# Patient Record
Sex: Female | Born: 1945 | Race: White | Hispanic: No | Smoking: Never smoker
Health system: Southern US, Community
[De-identification: ages and names within clinical notes are randomized; demographics above are authoritative.]

## PROBLEM LIST (undated history)

## (undated) DIAGNOSIS — E119 Type 2 diabetes mellitus without complications: Secondary | ICD-10-CM

## (undated) DIAGNOSIS — G20 Parkinson's disease: Secondary | ICD-10-CM

## (undated) DIAGNOSIS — I10 Essential (primary) hypertension: Secondary | ICD-10-CM

## (undated) DIAGNOSIS — G20A1 Parkinson's disease without dyskinesia, without mention of fluctuations: Secondary | ICD-10-CM

## (undated) DIAGNOSIS — E785 Hyperlipidemia, unspecified: Secondary | ICD-10-CM

## (undated) HISTORY — DX: Parkinson's disease: G20

## (undated) HISTORY — PX: BREAST CYST ASPIRATION: SHX578

## (undated) HISTORY — DX: Hyperlipidemia, unspecified: E78.5

## (undated) HISTORY — DX: Essential (primary) hypertension: I10

## (undated) HISTORY — DX: Parkinson's disease without dyskinesia, without mention of fluctuations: G20.A1

## (undated) HISTORY — DX: Type 2 diabetes mellitus without complications: E11.9

---

## 2004-07-01 ENCOUNTER — Ambulatory Visit: Payer: Self-pay | Admitting: Family Medicine

## 2009-09-18 ENCOUNTER — Ambulatory Visit: Payer: Self-pay | Admitting: Unknown Physician Specialty

## 2013-10-25 ENCOUNTER — Ambulatory Visit: Payer: Self-pay | Admitting: Family Medicine

## 2014-06-17 DIAGNOSIS — E782 Mixed hyperlipidemia: Secondary | ICD-10-CM | POA: Diagnosis present

## 2014-06-17 DIAGNOSIS — I10 Essential (primary) hypertension: Secondary | ICD-10-CM | POA: Diagnosis present

## 2014-10-21 ENCOUNTER — Ambulatory Visit
Admit: 2014-10-21 | Disposition: A | Discharge status: Home / Self Care | Payer: Self-pay | Attending: Unknown Physician Specialty | Admitting: Unknown Physician Specialty

## 2014-10-28 LAB — SURGICAL PATHOLOGY

## 2015-06-03 DIAGNOSIS — G20 Parkinson's disease: Secondary | ICD-10-CM | POA: Diagnosis present

## 2015-08-18 ENCOUNTER — Ambulatory Visit: Payer: Medicare Other | Admitting: Speech Pathology

## 2015-08-22 ENCOUNTER — Encounter: Payer: Self-pay | Admitting: Speech Pathology

## 2015-08-22 ENCOUNTER — Ambulatory Visit: Payer: Medicare Other | Attending: Neurology | Admitting: Speech Pathology

## 2015-08-22 DIAGNOSIS — R490 Dysphonia: Secondary | ICD-10-CM | POA: Insufficient documentation

## 2015-08-22 NOTE — Therapy (Signed)
[place] [facility] MAIN [facility] SERVICES [address] [place], [place], [address] Phone: [phone]   Fax:  [phone]  Speech Language Pathology Evaluation  Patient Details  Name: Lorraine Wyatt MRN: [id number] Date of Birth: 10-18-1945 Referring Provider: Paulita Fujita. Malvin Johns, MD  Encounter Date: 08/22/2015      End of Session - 08/22/15 1412    Visit Number 1   Number of Visits 17   Date for SLP Re-Evaluation 09/22/15   SLP Start Time 1100   SLP Stop Time  1150   SLP Time Calculation (min) 50 min   Activity Tolerance Patient tolerated treatment well      Past Medical History  Diagnosis Date   Diabetes mellitus without complication (HCC)    Hyperlipidemia    Hypertension    Parkinson disease (HCC)     History reviewed. No pertinent past surgical history.  There were no vitals filed for this visit.  Visit Diagnosis: Dysphonia      Subjective Assessment - 08/22/15 1410    Subjective The patient reports speech/voice changes due to Parkinson's; changes include hypophonia and hoarse vocal quality.  In addition, the patient reports that although she feels she speaks at an appropriate volume, she has received feedback from multiple sources over the past few months that she is quieter and less intelligible than she once was.  There are no reported swallowing problems.   Currently in Pain? No/denies            SLP Evaluation [facility] - 08/22/15 0001    SLP Visit Information   SLP Received On 08/22/15   Referring Provider Paulita Fujita. Malvin Johns, MD   Medical Diagnosis Parkinson's Disease   Prior Functional Status   Cognitive/Linguistic Baseline Within functional limits   Oral Motor/Sensory Function   Overall Oral Motor/Sensory Function Appears within functional limits for tasks assessed   Motor Speech   Overall Motor Speech Impaired   Respiration Within functional limits   Phonation Hoarse;Low vocal intensity   Resonance Within functional  limits   Articulation Within functional limitis   Intelligibility Intelligibility reduced   Word 75-100% accurate   Phrase 75-100% accurate   Sentence 75-100% accurate   Conversation 75-100% accurate   Motor Planning Witnin functional limits   Standardized Assessments   Standardized Assessments  Other Assessment  LSVT-Loud Evaluation       Maximum phonation time for sustained ah: 15 seconds Mean intensity during sustained ah: 75 dB  Mean intensity sustained during conversational speech: 63 dB Average fundamental frequency during sustained ah: 181 Hz (2.2 STD below mean for age and gender) Highest dynamic pitch when altering pitch from a low note to a high note: 457 Hz Highest pitch during conversational speech: 152 Hz Lowest dynamic pitch when altering from a high note to a low note: 138 Hz Lowest pitch during conversational speech: 121 Hz Visi-Pitch: Multi-Dimensional Voice Program (MDVP)  MDVP extracts objective quantitative values (Relative Average Perturbation, Shimmer, Voice Turbulence Index, and Noise to Harmonic Ratio) on sustained phonation, which are displayed graphically and numerically in comparison to a built-in normative database.  The patient exhibited values in the normal range for all dimensions. Average fundamental frequency was 2.2 STD below the average for age and gender. Stimulability: The patient was stimulable for achieving good quality, loud voice with moderate cues from clinician.                  SLP Education - 08/22/15 1411    Education provided  Yes   Education Details LSVT-Loud program and protocol    Person(s) Educated Patient   Methods Explanation;Demonstration   Comprehension Verbalized understanding;Returned demonstration            SLP Long Term Goals - 08/22/15 1421    SLP LONG TERM GOAL #1   Title The patient will complete Daily Tasks (Maximum duration "ah", High/Lows, and Functional Phrases) at average loudness of 80 dB  and with loud, good quality voice.    SLP LONG TERM GOAL #2   Title The patient will complete Hierarchal Speech Loudness reading drills (words/phrases, sentences, and paragraph) at average 75 dB and with loud, good quality voice.     SLP LONG TERM GOAL #3   Title The patient will complete homework daily.   SLP LONG TERM GOAL #4   Title The patient will participate in conversation, maintaining average loudness of 75 dB and loud, good quality voice.          Plan - 08/22/15 1413    Clinical Impression Statement Lorraine Wyatt is a pleasant, 70 year old female that presents today for an LSVT-Loud voice evaluation as a result of her recent diagnosis of Parkinson's disease. She is presenting with moderate dysphonia secondary to Parkinson's disease. The patient notes that she has received feedback from multiple sources over the past few months that she is less intelligible and quieter than she used to be. Although Ms. Mckendree does not perceive any changes in her overall loudness, she does feel that she experiences hoarseness and speaks with a slightly lower pitch. Ms. Rigdon was stimulable for achieving good quality, loud voice with moderate cues from the clinician today, and has a good prognosis for success in the LSVT-Loud program.    Speech Therapy Frequency 4x / week   Duration 4 weeks   Treatment/Interventions Other (comment)  LSVT-Loud   Potential to Achieve Goals Good   Potential Considerations Ability to learn/carryover information   SLP Home Exercise Plan LSVT-Loud homework and carryover exercises   Consulted and Agree with Plan of Care Patient        Problem List There are no active problems to display for this patient.   Lorraine Wyatt 08/22/2015, 2:23 PM  [place] [facility] MAIN [facility] SERVICES [address] [place], [place], [address] Phone: [phone]   Fax:  [phone]  Name: Lorraine Wyatt MRN: [id number] Date of Birth: [birth year]

## 2015-08-25 ENCOUNTER — Encounter: Payer: Self-pay | Admitting: Speech Pathology

## 2015-08-25 ENCOUNTER — Ambulatory Visit: Payer: Medicare Other | Admitting: Speech Pathology

## 2015-08-25 DIAGNOSIS — R490 Dysphonia: Secondary | ICD-10-CM

## 2015-08-25 NOTE — Therapy (Signed)
[place] [facility] MAIN [facility] SERVICES [address] [place], [place], [address] Phone: [phone]   Fax:  [phone]  Speech Language Pathology Treatment  Patient Details  Name: Lorraine Wyatt MRN: [id number] Date of Birth: 04-Nov-1945 Referring Provider: Paulita Fujita. Malvin Johns, MD  Encounter Date: 08/25/2015      End of Session - 08/25/15 1649    Visit Number 2   Number of Visits 17   Date for SLP Re-Evaluation 09/22/15   SLP Start Time 1500   SLP Stop Time  1600   SLP Time Calculation (min) 60 min   Activity Tolerance Patient tolerated treatment well      Past Medical History  Diagnosis Date  . Diabetes mellitus without complication (HCC)   . Hyperlipidemia   . Hypertension   . Parkinson disease (HCC)     History reviewed. No pertinent past surgical history.  There were no vitals filed for this visit.  Visit Diagnosis: Dysphonia      Subjective Assessment - 08/25/15 1648    Subjective Patient reports she has been trying to incorporate her loud voice into daily communication and has gotten some positive feedback.   Currently in Pain? No/denies               ADULT SLP TREATMENT - 08/25/15 0001    General Information   Behavior/Cognition Alert;Cooperative;Pleasant mood   Treatment Provided   Treatment provided Cognitive-Linquistic   Pain Assessment   Pain Assessment No/denies pain   Cognitive-Linquistic Treatment   Treatment focused on Voice   Skilled Treatment Daily Task #1 (Maximum sustained "ah"): Average 8 seconds, 79 dB. Daily Task 2 (Maximum fundamental frequency range): Highs: 15 high pitched "ah" given mod cues. Lows: 15 low pitched "ah" given mod cues. Daily task #3 (Maximum speech loudness drill of functional phrases): Average 74 dB.  Hierarchal speech loudness drill: Read paragraphs, 71 dB. Generate sentences, 71 dB.  Homework: assignments given.  Off the cuff remarks: average 64 dB; up to 72 dB given reminders to be  loud.     Assessment / Recommendations / Plan   Plan Continue with current plan of care   Progression Toward Goals   Progression toward goals Progressing toward goals          SLP Education - 08/25/15 1649    Education provided Yes   Education Details LSVT-Loud program and protocol   Person(s) Educated Patient   Methods Explanation;Demonstration   Comprehension Verbalized understanding;Returned demonstration            SLP Long Term Goals - 08/22/15 1421    SLP LONG TERM GOAL #1   Title The patient will complete Daily Tasks (Maximum duration "ah", High/Lows, and Functional Phrases) at average loudness of 80 dB and with loud, good quality voice.    SLP LONG TERM GOAL #2   Title The patient will complete Hierarchal Speech Loudness reading drills (words/phrases, sentences, and paragraph) at average 75 dB and with loud, good quality voice.     SLP LONG TERM GOAL #3   Title The patient will complete homework daily.   SLP LONG TERM GOAL #4   Title The patient will participate in conversation, maintaining average loudness of 75 dB and loud, good quality voice.          Plan - 08/25/15 1649    Clinical Impression Statement The patient completed the daily tasks and hierarchal speech drill tasks with loud, good quality voice given max SLP cues.  She  requires max cueing for generalization into conversational speech.   Speech Therapy Frequency 4x / week   Duration 4 weeks   Treatment/Interventions Other (comment)  LSVT-Loud   Potential to Achieve Goals Good   Potential Considerations Ability to learn/carryover information   SLP Home Exercise Plan LSVT-Loud homework and carryover exercises   Consulted and Agree with Plan of Care Patient        Problem List There are no active problems to display for this patient.   Elsie Stain 08/25/2015, 4:50 PM  [place] [facility] MAIN [facility] SERVICES [address] [place], [place], [address] Phone:  [phone]   Fax:  [phone]   Name: Lorraine Wyatt MRN: [id number] Date of Birth: Feb 20, 1946

## 2015-08-26 ENCOUNTER — Ambulatory Visit: Payer: Medicare Other | Admitting: Speech Pathology

## 2015-08-26 DIAGNOSIS — R490 Dysphonia: Secondary | ICD-10-CM

## 2015-08-27 ENCOUNTER — Ambulatory Visit: Payer: Medicare Other | Admitting: Speech Pathology

## 2015-08-27 ENCOUNTER — Encounter: Payer: Self-pay | Admitting: Speech Pathology

## 2015-08-27 DIAGNOSIS — R490 Dysphonia: Secondary | ICD-10-CM

## 2015-08-27 NOTE — Therapy (Signed)
[place] [facility] MAIN [facility] SERVICES [address] [place], [place], [address] Phone: [phone]   Fax:  [phone]  Speech Language Pathology Treatment  Patient Details  Name: Lorraine Wyatt MRN: [id number] Date of Birth: [birth year] Referring Provider: Paulita Fujita. Malvin Johns, MD  Encounter Date: 08/26/2015      End of Session - 08/27/15 0934    Visit Number 3   Number of Visits 17   Date for SLP Re-Evaluation 09/22/15   SLP Start Time 1500   SLP Stop Time  1556   SLP Time Calculation (min) 56 min   Activity Tolerance Patient tolerated treatment well      Past Medical History  Diagnosis Date  . Diabetes mellitus without complication (HCC)   . Hyperlipidemia   . Hypertension   . Parkinson disease (HCC)     History reviewed. No pertinent past surgical history.  There were no vitals filed for this visit.  Visit Diagnosis: Dysphonia      Subjective Assessment - 08/27/15 0933    Subjective Patient reports she has been trying to incorporate her loud voice into daily communication and has gotten some positive feedback.   Currently in Pain? No/denies               ADULT SLP TREATMENT - 08/27/15 0001    General Information   Behavior/Cognition Alert;Cooperative;Pleasant mood   Treatment Provided   Treatment provided Cognitive-Linquistic   Pain Assessment   Pain Assessment No/denies pain   Cognitive-Linquistic Treatment   Treatment focused on Voice   Skilled Treatment Daily Task #1 (Maximum sustained "ah"): Average 12 seconds, 82 dB. Daily Task 2 (Maximum fundamental frequency range): Highs: 15 high pitched "ah" given mod cues. Lows: 15 low pitched "ah" given mod cues. Daily task #3 (Maximum speech loudness drill of functional phrases): Average 75 dB.  Hierarchal speech loudness drill: Read sentences, 75 dB. Read paragraphs, 71 dB.  Homework: assignments completed.  Off the cuff remarks: average 64 dB; up to 75 dB given reminders to be  loud.     Assessment / Recommendations / Plan   Plan Continue with current plan of care   Progression Toward Goals   Progression toward goals Progressing toward goals          SLP Education - 08/27/15 0933    Education provided Yes   Education Details LSVT-LOUD   Person(s) Educated Patient   Methods Explanation;Demonstration;Verbal cues;Handout   Comprehension Verbalized understanding;Returned demonstration;Verbal cues required;Need further instruction            SLP Long Term Goals - 08/22/15 1421    SLP LONG TERM GOAL #1   Title The patient will complete Daily Tasks (Maximum duration "ah", High/Lows, and Functional Phrases) at average loudness of 80 dB and with loud, good quality voice.    SLP LONG TERM GOAL #2   Title The patient will complete Hierarchal Speech Loudness reading drills (words/phrases, sentences, and paragraph) at average 75 dB and with loud, good quality voice.     SLP LONG TERM GOAL #3   Title The patient will complete homework daily.   SLP LONG TERM GOAL #4   Title The patient will participate in conversation, maintaining average loudness of 75 dB and loud, good quality voice.          Plan - 08/27/15 0934    Clinical Impression Statement The patient completed the daily tasks and hierarchal speech drill tasks with loud, good quality voice given max SLP cues.  She requires max cueing for generalization into conversational speech.   Speech Therapy Frequency 4x / week   Duration 4 weeks   Treatment/Interventions Other (comment)  LSVT-LOUD   Potential to Achieve Goals Good   Potential Considerations Ability to learn/carryover information;Cooperation/participation level;Previous level of function;Severity of impairments;Family/community support   SLP Home Exercise Plan LSVT-Loud homework and carryover exercises   Consulted and Agree with Plan of Care Patient        Problem List There are no active problems to display for this patient.  Dollene Primrose, MS/CCC- SLP  Leandrew Koyanagi 08/27/2015, 9:36 AM  [place] MAIN [facility] SERVICES [address] [place], [place], [address] Phone: [phone]   Fax:  [phone]   Name: Lorraine Wyatt MRN: [id number] Date of Birth: June 06, 1946

## 2015-08-28 ENCOUNTER — Encounter: Payer: Self-pay | Admitting: Speech Pathology

## 2015-08-28 ENCOUNTER — Ambulatory Visit: Payer: Medicare Other | Admitting: Speech Pathology

## 2015-08-28 DIAGNOSIS — R490 Dysphonia: Secondary | ICD-10-CM

## 2015-08-28 NOTE — Therapy (Signed)
[place] [facility] MAIN [facility] SERVICES [address] [place], [place], [address] Phone: [phone]   Fax:  [phone]  Speech Language Pathology Treatment  Patient Details  Name: MARLENE BEIDLER MRN: [id number] Date of Birth: [birth year] Referring Provider: Paulita Fujita. Malvin Johns, MD  Encounter Date: 08/27/2015      End of Session - 08/28/15 1057    Visit Number 4   Number of Visits 17   Date for SLP Re-Evaluation 09/22/15   SLP Start Time 1500   SLP Stop Time  1600   SLP Time Calculation (min) 60 min   Activity Tolerance Patient tolerated treatment well      Past Medical History  Diagnosis Date  . Diabetes mellitus without complication (HCC)   . Hyperlipidemia   . Hypertension   . Parkinson disease (HCC)     History reviewed. No pertinent past surgical history.  There were no vitals filed for this visit.  Visit Diagnosis: Dysphonia      Subjective Assessment - 08/28/15 1056    Subjective Patient reports she has been incorporating her loud voice into daily communication and has gotten some positive feedback.   Currently in Pain? No/denies               ADULT SLP TREATMENT - 08/28/15 0001    General Information   Behavior/Cognition Alert;Cooperative;Pleasant mood   Treatment Provided   Treatment provided Cognitive-Linquistic   Pain Assessment   Pain Assessment No/denies pain   Cognitive-Linquistic Treatment   Treatment focused on Voice   Skilled Treatment Daily Task #1 (Maximum sustained "ah"): Average 15 seconds, 80 dB. Daily Task 2 (Maximum fundamental frequency range): Highs: 15 high pitched "ah" given mod cues. Lows: 15 low pitched "ah" given mod cues. Daily task #3 (Maximum speech loudness drill of functional phrases): Average 73 dB.  Hierarchal speech loudness drill: Read paragraphs, 73 dB. Generate sentences, 71 dB.  Homework: assignments given.  Off the cuff remarks: average 68 dB; up to 72 dB given reminders to be loud.      Assessment / Recommendations / Plan   Plan Continue with current plan of care   Progression Toward Goals   Progression toward goals Progressing toward goals          SLP Education - 08/28/15 1057    Education provided Yes   Education Details LSVT-Loud   Person(s) Educated Patient   Methods Explanation;Demonstration;Verbal cues;Handout   Comprehension Verbalized understanding;Returned demonstration;Verbal cues required;Need further instruction            SLP Long Term Goals - 08/22/15 1421    SLP LONG TERM GOAL #1   Title The patient will complete Daily Tasks (Maximum duration "ah", High/Lows, and Functional Phrases) at average loudness of 80 dB and with loud, good quality voice.    SLP LONG TERM GOAL #2   Title The patient will complete Hierarchal Speech Loudness reading drills (words/phrases, sentences, and paragraph) at average 75 dB and with loud, good quality voice.     SLP LONG TERM GOAL #3   Title The patient will complete homework daily.   SLP LONG TERM GOAL #4   Title The patient will participate in conversation, maintaining average loudness of 75 dB and loud, good quality voice.          Plan - 08/28/15 1057    Clinical Impression Statement The patient is completing daily tasks and hierarchal speech drill tasks with loud, good quality voice given max SLP cues.  She  continues to require max cueing for generalization into conversational speech.   Speech Therapy Frequency 4x / week   Duration 4 weeks   Treatment/Interventions Other (comment)  LSVT-Loud   Potential to Achieve Goals Good   Potential Considerations Ability to learn/carryover information;Cooperation/participation level;Previous level of function;Severity of impairments;Family/community support   SLP Home Exercise Plan LSVT-Loud homework and carryover exercises   Consulted and Agree with Plan of Care Patient        Problem List There are no active problems to display for this patient.   Elsie Stain 08/28/2015, 10:58 AM  [place] [facility] MAIN [facility] SERVICES [address] [place], [place], [address] Phone: [phone]   Fax:  [phone]   Name: ADALAIDE JASKOLSKI MRN: [id number] Date of Birth: 07/13/1945

## 2015-08-28 NOTE — Therapy (Signed)
[place] [facility] MAIN [facility] SERVICES [address] [place], [place], [address] Phone: [phone]   Fax:  [phone]  Speech Language Pathology Treatment  Patient Details  Name: Lorraine Wyatt MRN: [id number] Date of Birth: May 21, 1946 Referring Provider: Paulita Fujita. Malvin Johns, MD  Encounter Date: 08/28/2015      End of Session - 08/28/15 1607    Visit Number 5   Number of Visits 17   Date for SLP Re-Evaluation 09/22/15   SLP Start Time 1501   SLP Stop Time  1600   SLP Time Calculation (min) 59 min   Activity Tolerance Patient tolerated treatment well      Past Medical History  Diagnosis Date  . Diabetes mellitus without complication (HCC)   . Hyperlipidemia   . Hypertension   . Parkinson disease (HCC)     History reviewed. No pertinent past surgical history.  There were no vitals filed for this visit.  Visit Diagnosis: Dysphonia      Subjective Assessment - 08/28/15 1606    Subjective Patient reports she has been incorporating her loud voice into daily communication and has gotten some positive feedback.   Currently in Pain? No/denies               ADULT SLP TREATMENT - 08/28/15 1605    General Information   Behavior/Cognition Alert;Cooperative;Pleasant mood   Treatment Provided   Treatment provided Cognitive-Linquistic   Pain Assessment   Pain Assessment No/denies pain   Cognitive-Linquistic Treatment   Treatment focused on Voice   Skilled Treatment Daily Task #1 (Maximum sustained "ah"): Average 13 seconds, 83 dB. Daily Task 2 (Maximum fundamental frequency range): Highs: 15 high pitched "ah" given mod cues. Lows: 15 low pitched "ah" given mod cues. Daily task #3 (Maximum speech loudness drill of functional phrases): Average 75 dB.  Hierarchal speech loudness drill: Read sentences, 75 dB. Read paragraphs, 72 dB.  Generate short responses to simple linguistic task, 72 dB.  Homework: assignments completed.  Off the cuff  remarks: average 68 dB; up to 75 dB given reminders to be loud.     Assessment / Recommendations / Plan   Plan Continue with current plan of care   Progression Toward Goals   Progression toward goals Progressing toward goals          SLP Education - 08/28/15 1606    Education provided Yes   Education Details LSVT-LOUD   Person(s) Educated Patient   Methods Explanation;Demonstration;Verbal cues;Handout   Comprehension Verbalized understanding;Returned demonstration;Verbal cues required;Need further instruction            SLP Long Term Goals - 08/22/15 1421    SLP LONG TERM GOAL #1   Title The patient will complete Daily Tasks (Maximum duration "ah", High/Lows, and Functional Phrases) at average loudness of 80 dB and with loud, good quality voice.    SLP LONG TERM GOAL #2   Title The patient will complete Hierarchal Speech Loudness reading drills (words/phrases, sentences, and paragraph) at average 75 dB and with loud, good quality voice.     SLP LONG TERM GOAL #3   Title The patient will complete homework daily.   SLP LONG TERM GOAL #4   Title The patient will participate in conversation, maintaining average loudness of 75 dB and loud, good quality voice.          Plan - 08/28/15 1607    Clinical Impression Statement The patient is completing daily tasks and hierarchal speech drill tasks with  loud, good quality voice given mod SLP cues.  She is demonstrating emerging generalization into conversational speech.   Speech Therapy Frequency 4x / week   Duration 4 weeks   Treatment/Interventions Other (comment)  LSVT-LOUD   Potential to Achieve Goals Good   Potential Considerations Ability to learn/carryover information;Cooperation/participation level;Previous level of function;Severity of impairments;Family/community support   SLP Home Exercise Plan LSVT-Loud homework and carryover exercises   Consulted and Agree with Plan of Care Patient        Problem List There are  no active problems to display for this patient.  Dollene Primrose, MS/CCC- SLP  Leandrew Koyanagi 08/28/2015, 4:08 PM  [facility] MAIN [facility] SERVICES [address] [place], [place], [address] Phone: [phone]   Fax:  [phone]   Name: ZAKIRA RESSEL MRN: [id number] Date of Birth: 06-Oct-1945

## 2015-09-01 ENCOUNTER — Ambulatory Visit: Payer: Medicare Other | Admitting: Speech Pathology

## 2015-09-01 DIAGNOSIS — R490 Dysphonia: Secondary | ICD-10-CM | POA: Diagnosis not present

## 2015-09-02 ENCOUNTER — Encounter: Payer: Self-pay | Admitting: Speech Pathology

## 2015-09-02 ENCOUNTER — Ambulatory Visit: Payer: Medicare Other | Admitting: Speech Pathology

## 2015-09-02 DIAGNOSIS — R490 Dysphonia: Secondary | ICD-10-CM | POA: Diagnosis not present

## 2015-09-02 NOTE — Therapy (Signed)
[facility] MAIN [facility] SERVICES [address] [place], [place], [address] Phone: [phone]   Fax:  [phone]  Speech Language Pathology Treatment  Patient Details  Name: Lorraine Wyatt MRN: [id number] Date of Birth: [birth year] Referring Provider: Paulita Fujita. Malvin Johns, MD  Encounter Date: 09/02/2015      End of Session - 09/02/15 1616    Visit Number 7   Number of Visits 17   Date for SLP Re-Evaluation 09/22/15   SLP Start Time 1500   SLP Stop Time  1600   SLP Time Calculation (min) 60 min   Activity Tolerance Patient tolerated treatment well      Past Medical History  Diagnosis Date  . Diabetes mellitus without complication (HCC)   . Hyperlipidemia   . Hypertension   . Parkinson disease (HCC)     History reviewed. No pertinent past surgical history.  There were no vitals filed for this visit.  Visit Diagnosis: Dysphonia      Subjective Assessment - 09/02/15 1615    Subjective Patient reports she has been incorporating her loud voice into daily communication and is getting some positive feedback.               ADULT SLP TREATMENT - 09/02/15 1614    General Information   Behavior/Cognition Alert;Cooperative;Pleasant mood   Treatment Provided   Treatment provided Cognitive-Linquistic   Pain Assessment   Pain Assessment No/denies pain   Cognitive-Linquistic Treatment   Treatment focused on Voice   Skilled Treatment Daily Task #1 (Maximum sustained "ah"): Average 12 seconds, 84 dB. Daily Task 2 (Maximum fundamental frequency range): Highs: 15 high pitched "ah" given mod cues. Lows: 15 low pitched "ah" given mod cues. Daily task #3 (Maximum speech loudness drill of functional phrases): Average 75 dB.  Hierarchal speech loudness drill: Read sentences, 75 dB. Read paragraphs, 73 dB.  Generate short responses to simple linguistic task, 70 dB given min cues.  Homework: assignments completed.  Off the cuff remarks: average 64  dB; up to 75 dB given reminders to be loud.     Assessment / Recommendations / Plan   Plan Continue with current plan of care   Progression Toward Goals   Progression toward goals Progressing toward goals          SLP Education - 09/02/15 1615    Education provided Yes   Education Details LSVT-LOUD   Person(s) Educated Patient   Methods Explanation;Demonstration;Verbal cues;Handout   Comprehension Verbalized understanding;Returned demonstration;Verbal cues required;Need further instruction            SLP Long Term Goals - 08/22/15 1421    SLP LONG TERM GOAL #1   Title The patient will complete Daily Tasks (Maximum duration "ah", High/Lows, and Functional Phrases) at average loudness of 80 dB and with loud, good quality voice.    SLP LONG TERM GOAL #2   Title The patient will complete Hierarchal Speech Loudness reading drills (words/phrases, sentences, and paragraph) at average 75 dB and with loud, good quality voice.     SLP LONG TERM GOAL #3   Title The patient will complete homework daily.   SLP LONG TERM GOAL #4   Title The patient will participate in conversation, maintaining average loudness of 75 dB and loud, good quality voice.          Plan - 09/02/15 1617    Clinical Impression Statement The patient is completing daily tasks and hierarchal speech drill tasks with loud, good quality  voice given mod SLP cues.  She is demonstrating inconsistent, but emerging generalization into conversational speech.   Speech Therapy Frequency 4x / week   Duration 4 weeks   Treatment/Interventions Other (comment)  LSVT-LOUD   Potential to Achieve Goals Good   Potential Considerations Ability to learn/carryover information;Cooperation/participation level;Previous level of function;Severity of impairments;Family/community support   SLP Home Exercise Plan LSVT-Loud homework and carryover exercises   Consulted and Agree with Plan of Care Patient        Problem List There are no  active problems to display for this patient.  Dollene Primrose, MS/CCC- SLP  Leandrew Koyanagi 09/02/2015, 4:18 PM  [place] [facility] MAIN [facility] SERVICES [address] [place], [place], [address] Phone: [phone]   Fax:  [phone]   Name: Lorraine Wyatt MRN: [id number] Date of Birth: 30-Oct-1945

## 2015-09-02 NOTE — Therapy (Signed)
[place] [facility] MAIN [facility] SERVICES [address] [place], [place], [address] Phone: [phone]   Fax:  [phone]  Speech Language Pathology Treatment  Patient Details  Name: Lorraine Wyatt MRN: [id number] Date of Birth: 1945/08/22 Referring Provider: Paulita Fujita. Malvin Johns, MD  Encounter Date: 09/01/2015      End of Session - 09/02/15 6962    Visit Number 6   Number of Visits 17   Date for SLP Re-Evaluation 09/22/15   SLP Start Time 1502   SLP Stop Time  1600   SLP Time Calculation (min) 58 min   Activity Tolerance Patient tolerated treatment well      Past Medical History  Diagnosis Date  . Diabetes mellitus without complication (HCC)   . Hyperlipidemia   . Hypertension   . Parkinson disease (HCC)     History reviewed. No pertinent past surgical history.  There were no vitals filed for this visit.  Visit Diagnosis: Dysphonia      Subjective Assessment - 09/02/15 0837    Subjective Patient reports she has been incorporating her loud voice into daily communication and is getting some positive feedback.   Currently in Pain? No/denies               ADULT SLP TREATMENT - 09/02/15 0001    General Information   Behavior/Cognition Alert;Cooperative;Pleasant mood   Treatment Provided   Treatment provided Cognitive-Linquistic   Pain Assessment   Pain Assessment No/denies pain   Cognitive-Linquistic Treatment   Treatment focused on Voice   Skilled Treatment Daily Task #1 (Maximum sustained "ah"): Average 11 seconds, 82 dB. Daily Task 2 (Maximum fundamental frequency range): Highs: 15 high pitched "ah" given mod cues. Lows: 15 low pitched "ah" given mod cues. Daily task #3 (Maximum speech loudness drill of functional phrases): Average 73 dB.  Hierarchal speech loudness drill: Read paragraphs, 73 dB. Generate sentences, 71 dB.  Homework: assignments given.  Off the cuff remarks: average 67 dB; up to 74 dB given reminders to be loud.      Assessment / Recommendations / Plan   Plan Continue with current plan of care   Progression Toward Goals   Progression toward goals Progressing toward goals          SLP Education - 09/02/15 0837    Education provided Yes   Education Details LSVT-Loud   Person(s) Educated Patient   Methods Explanation;Demonstration   Comprehension Verbalized understanding;Returned demonstration            SLP Long Term Goals - 08/22/15 1421    SLP LONG TERM GOAL #1   Title The patient will complete Daily Tasks (Maximum duration "ah", High/Lows, and Functional Phrases) at average loudness of 80 dB and with loud, good quality voice.    SLP LONG TERM GOAL #2   Title The patient will complete Hierarchal Speech Loudness reading drills (words/phrases, sentences, and paragraph) at average 75 dB and with loud, good quality voice.     SLP LONG TERM GOAL #3   Title The patient will complete homework daily.   SLP LONG TERM GOAL #4   Title The patient will participate in conversation, maintaining average loudness of 75 dB and loud, good quality voice.          Plan - 09/02/15 9528    Clinical Impression Statement The patient is completing daily tasks and hierarchal speech drill tasks with loud, good quality voice given max SLP cues.  She continues to require max cueing  for generalization into conversational speech.   Speech Therapy Frequency 4x / week   Duration 4 weeks   Treatment/Interventions Other (comment)  LSVT-Loud   Potential to Achieve Goals Good   Potential Considerations Ability to learn/carryover information;Cooperation/participation level;Previous level of function;Severity of impairments;Family/community support   SLP Home Exercise Plan LSVT-Loud homework and carryover exercises   Consulted and Agree with Plan of Care Patient        Problem List There are no active problems to display for this patient.   Elsie Stain 09/02/2015, 8:39 AM  [place] MAIN [facility] SERVICES [address] [place], [place], [address] Phone: [phone]   Fax:  [phone]   Name: Lorraine Wyatt MRN: [id number] Date of Birth: Jul 08, 1945

## 2015-09-03 ENCOUNTER — Ambulatory Visit: Payer: Medicare Other | Attending: Neurology | Admitting: Speech Pathology

## 2015-09-03 DIAGNOSIS — R490 Dysphonia: Secondary | ICD-10-CM | POA: Diagnosis present

## 2015-09-04 ENCOUNTER — Encounter: Payer: Self-pay | Admitting: Speech Pathology

## 2015-09-04 ENCOUNTER — Ambulatory Visit: Payer: Medicare Other | Admitting: Speech Pathology

## 2015-09-04 DIAGNOSIS — R490 Dysphonia: Secondary | ICD-10-CM | POA: Diagnosis not present

## 2015-09-04 NOTE — Therapy (Signed)
[place] [facility] MAIN [facility] SERVICES [address] [place], [place], [address] Phone: [phone]   Fax:  [phone]  Speech Language Pathology Treatment  Patient Details  Name: BONA HUBBARD MRN: [id number] Date of Birth: Nov 29, 1945 Referring Provider: Paulita Fujita. Malvin Johns, MD  Encounter Date: 09/03/2015      End of Session - 09/04/15 0958    Visit Number 8   Number of Visits 17   Date for SLP Re-Evaluation 09/22/15   SLP Start Time 1500   SLP Stop Time  1554   SLP Time Calculation (min) 54 min   Activity Tolerance Patient tolerated treatment well      Past Medical History  Diagnosis Date  . Diabetes mellitus without complication (HCC)   . Hyperlipidemia   . Hypertension   . Parkinson disease (HCC)     History reviewed. No pertinent past surgical history.  There were no vitals filed for this visit.  Visit Diagnosis: Dysphonia      Subjective Assessment - 09/04/15 0956    Subjective Patient reports she continues to incorporate her loud voice into daily communication and is getting some positive feedback.   Currently in Pain? No/denies               ADULT SLP TREATMENT - 09/04/15 0001    General Information   Behavior/Cognition Alert;Cooperative;Pleasant mood   Treatment Provided   Treatment provided Cognitive-Linquistic   Pain Assessment   Pain Assessment No/denies pain   Cognitive-Linquistic Treatment   Treatment focused on Voice   Skilled Treatment Daily Task #1 (Maximum sustained "ah"): Average 13 seconds, 83 dB. Daily Task 2 (Maximum fundamental frequency range): Highs: 15 high pitched "ah" given mod cues. Lows: 15 low pitched "ah" given mod cues. Daily task #3 (Maximum speech loudness drill of functional phrases): Average 74 dB.  Hierarchal speech loudness drill: Read paragraphs, 72 dB. Generate sentences, 73 dB.  Homework: assignments given.  Off the cuff remarks: average 70 dB; up to 74 dB given reminders to be loud.      Assessment / Recommendations / Plan   Plan Continue with current plan of care   Progression Toward Goals   Progression toward goals Progressing toward goals          SLP Education - 09/04/15 0957    Education provided Yes   Education Details LSVT-Loud   Person(s) Educated Patient   Methods Explanation;Demonstration;Verbal cues   Comprehension Verbalized understanding;Returned demonstration            SLP Long Term Goals - 08/22/15 1421    SLP LONG TERM GOAL #1   Title The patient will complete Daily Tasks (Maximum duration "ah", High/Lows, and Functional Phrases) at average loudness of 80 dB and with loud, good quality voice.    SLP LONG TERM GOAL #2   Title The patient will complete Hierarchal Speech Loudness reading drills (words/phrases, sentences, and paragraph) at average 75 dB and with loud, good quality voice.     SLP LONG TERM GOAL #3   Title The patient will complete homework daily.   SLP LONG TERM GOAL #4   Title The patient will participate in conversation, maintaining average loudness of 75 dB and loud, good quality voice.          Plan - 09/04/15 0958    Clinical Impression Statement The patient is completing daily tasks and hierarchal speech drill tasks with loud, good quality voice given mod SLP cues.  She is demonstrating inconsistent, but  emerging generalization into conversational speech.   Speech Therapy Frequency 4x / week   Duration 4 weeks   Treatment/Interventions Other (comment)  LSVT-Loud   Potential to Achieve Goals Good   Potential Considerations Ability to learn/carryover information;Cooperation/participation level;Previous level of function;Severity of impairments;Family/community support   SLP Home Exercise Plan LSVT-Loud homework and carryover exercises   Consulted and Agree with Plan of Care Patient        Problem List There are no active problems to display for this patient.   Elsie Stain 09/04/2015, 9:59 AM  [facility] MAIN [facility] SERVICES [address] [place], [place], [address] Phone: [phone]   Fax:  [phone]   Name: TIASIA WEBERG MRN: [id number] Date of Birth: 06/26/1946

## 2015-09-04 NOTE — Therapy (Signed)
[place] [facility] MAIN [facility] SERVICES [address] [place], [place], [address] Phone: [phone]   Fax:  [phone]  Speech Language Pathology Treatment  Patient Details  Name: MAKENZYE TROUTMAN MRN: [id number] Date of Birth: 11/12/1945 Referring Provider: Paulita Fujita. Malvin Johns, MD  Encounter Date: 09/04/2015      End of Session - 09/04/15 1609    Visit Number 9   Number of Visits 17   Date for SLP Re-Evaluation 09/22/15   SLP Start Time 1500   SLP Stop Time  1600   SLP Time Calculation (min) 60 min   Activity Tolerance Patient tolerated treatment well      Past Medical History  Diagnosis Date  . Diabetes mellitus without complication (HCC)   . Hyperlipidemia   . Hypertension   . Parkinson disease (HCC)     History reviewed. No pertinent past surgical history.  There were no vitals filed for this visit.  Visit Diagnosis: Dysphonia      Subjective Assessment - 09/04/15 1609    Subjective Patient reports she continues to incorporate her loud voice into daily communication and is getting some positive feedback.   Currently in Pain? No/denies               ADULT SLP TREATMENT - 09/04/15 1607    General Information   Behavior/Cognition Alert;Cooperative;Pleasant mood   Treatment Provided   Treatment provided Cognitive-Linquistic   Pain Assessment   Pain Assessment No/denies pain   Cognitive-Linquistic Treatment   Treatment focused on Voice   Skilled Treatment Daily Task #1 (Maximum sustained "ah"): Average 14 seconds, 84 dB. Daily Task 2 (Maximum fundamental frequency range): Highs: 15 high pitched "ah" given min cues. Lows: 15 low pitched "ah" given min cues. Daily task #3 (Maximum speech loudness drill of functional phrases): Average 75 dB.  Hierarchal speech loudness drill: Read paragraphs, 74 dB.  Generate short responses to simple linguistic task, 73 dB given mod cues.  Homework: assignments completed.  Off the cuff remarks:  average 68 dB; up to 75 dB given reminders to be loud.     Assessment / Recommendations / Plan   Plan Continue with current plan of care   Progression Toward Goals   Progression toward goals Progressing toward goals          SLP Education - 09/04/15 1609    Education provided Yes   Education Details LSVT-LOUD   Person(s) Educated Patient   Methods Explanation;Demonstration;Verbal cues;Handout   Comprehension Verbalized understanding;Returned demonstration;Verbal cues required;Need further instruction            SLP Long Term Goals - 08/22/15 1421    SLP LONG TERM GOAL #1   Title The patient will complete Daily Tasks (Maximum duration "ah", High/Lows, and Functional Phrases) at average loudness of 80 dB and with loud, good quality voice.    SLP LONG TERM GOAL #2   Title The patient will complete Hierarchal Speech Loudness reading drills (words/phrases, sentences, and paragraph) at average 75 dB and with loud, good quality voice.     SLP LONG TERM GOAL #3   Title The patient will complete homework daily.   SLP LONG TERM GOAL #4   Title The patient will participate in conversation, maintaining average loudness of 75 dB and loud, good quality voice.          Plan - 09/04/15 1610    Clinical Impression Statement The patient is completing daily tasks and hierarchal speech drill tasks with loud,  good quality voice given min SLP cues.  She is demonstrating inconsistent, but emerging generalization into conversational speech.   Speech Therapy Frequency 4x / week   Duration 4 weeks   Treatment/Interventions Other (comment)  LSVT-LOUD   Potential to Achieve Goals Good   Potential Considerations Ability to learn/carryover information;Cooperation/participation level;Previous level of function;Severity of impairments;Family/community support   SLP Home Exercise Plan LSVT-Loud homework and carryover exercises   Consulted and Agree with Plan of Care Patient        Problem  List There are no active problems to display for this patient.  Dollene Primrose, MS/CCC- SLP  Leandrew Koyanagi 09/04/2015, 4:11 PM  [place] [facility] [facility] MAIN [facility] SERVICES [address] [place], [place], [address] Phone: [phone]   Fax:  [phone]   Name: CAROLANN BRAZELL MRN: [id number] Date of Birth: [birth year]

## 2015-09-08 ENCOUNTER — Ambulatory Visit: Payer: Medicare Other | Admitting: Speech Pathology

## 2015-09-08 DIAGNOSIS — R490 Dysphonia: Secondary | ICD-10-CM | POA: Diagnosis not present

## 2015-09-09 ENCOUNTER — Ambulatory Visit: Payer: Medicare Other | Admitting: Speech Pathology

## 2015-09-09 ENCOUNTER — Encounter: Payer: Self-pay | Admitting: Speech Pathology

## 2015-09-09 DIAGNOSIS — R490 Dysphonia: Secondary | ICD-10-CM

## 2015-09-09 NOTE — Therapy (Signed)
[place] MAIN [facility] SERVICES [address] [place], [place], [address] Phone: [phone]   Fax:  [phone]  Speech Language Pathology Treatment  Patient Details  Name: Lorraine Wyatt MRN: [id number] Date of Birth: 1946/04/26 Referring Provider: Doneta Public. Melrose Nakayama, MD  Encounter Date: 09/08/2015      End of Session - 09/09/15 1148    Visit Number 10   Number of Visits 17   Date for SLP Re-Evaluation 09/22/15   SLP Start Time 1500   SLP Stop Time  4709   SLP Time Calculation (min) 57 min   Activity Tolerance Patient tolerated treatment well      Past Medical History  Diagnosis Date  . Diabetes mellitus without complication ([place])   . Hyperlipidemia   . Hypertension   . Parkinson disease ([place])     History reviewed. No pertinent past surgical history.  There were no vitals filed for this visit.  Visit Diagnosis: Dysphonia      Subjective Assessment - 09/09/15 1147    Subjective Patient reports she continues to incorporate her loud voice into daily communication and is getting some positive feedback.   Currently in Pain? No/denies               ADULT SLP TREATMENT - 09/09/15 0001    General Information   Behavior/Cognition Alert;Cooperative;Pleasant mood   Treatment Provided   Treatment provided Cognitive-Linquistic   Pain Assessment   Pain Assessment No/denies pain   Cognitive-Linquistic Treatment   Treatment focused on Voice   Skilled Treatment Daily Task #1 (Maximum sustained "ah"): Average 17 seconds, 84 dB. Daily Task 2 (Maximum fundamental frequency range): Highs: 15 high pitched "ah" given mod cues. Lows: 15 low pitched "ah" given mod cues. Daily task #3 (Maximum speech loudness drill of functional phrases): Average 73 dB.  Hierarchal speech loudness drill: Read paragraphs, 73 dB. Generate sentences, 74 dB.  Homework: assignments given.  Off the cuff remarks: average 69 dB; up to 75 dB given reminders to be loud.      Assessment / Recommendations / Plan   Plan Continue with current plan of care   Progression Toward Goals   Progression toward goals Progressing toward goals          SLP Education - 09/09/15 1148    Education provided Yes   Education Details LSVT-Loud   Person(s) Educated Patient   Methods Explanation;Demonstration;Verbal cues;Handout   Comprehension Verbalized understanding;Returned demonstration;Verbal cues required;Need further instruction            SLP Long Term Goals - 09/09/15 6283    SLP LONG TERM GOAL #1   Title The patient will complete Daily Tasks (Maximum duration "ah", High/Lows, and Functional Phrases) at average loudness of 80 dB and with loud, good quality voice.    Time 4   Period Weeks   Status Partially Met   SLP LONG TERM GOAL #2   Title The patient will complete Hierarchal Speech Loudness reading drills (words/phrases, sentences, and paragraph) at average 75 dB and with loud, good quality voice.     Time 4   Period Weeks   Status Partially Met   SLP LONG TERM GOAL #3   Title The patient will complete homework daily.   Time 4   Period Weeks   Status On-going   SLP LONG TERM GOAL #4   Title The patient will participate in conversation, maintaining average loudness of 75 dB and loud, good quality voice.   Time  4   Period Weeks   Status Partially Met          Plan - 09/09/15 1200    Clinical Impression Statement The patient is completing daily tasks and hierarchal speech drill tasks with loud, good quality voice given min SLP cues.  She is demonstrating inconsistent, but emerging generalization into conversational speech.   Speech Therapy Frequency 4x / week   Duration 4 weeks   Treatment/Interventions Other (comment)  LSVT-Loud   Potential to Achieve Goals Good   Potential Considerations Ability to learn/carryover information;Cooperation/participation level;Previous level of function;Severity of impairments;Family/community support   SLP  Home Exercise Plan LSVT-Loud homework and carryover exercises   Consulted and Agree with Plan of Care Patient        Problem List There are no active problems to display for this patient.   Wynelle Cleveland 09/09/2015, 12:01 PM  [place] MAIN [facility] SERVICES [address] [place], [place], [address] Phone: [phone]   Fax:  [phone]   Name: Lorraine Wyatt MRN: [id number] Date of Birth: 03-27-1946

## 2015-09-10 ENCOUNTER — Encounter: Payer: Self-pay | Admitting: Speech Pathology

## 2015-09-10 ENCOUNTER — Ambulatory Visit: Payer: Medicare Other | Admitting: Speech Pathology

## 2015-09-10 DIAGNOSIS — R490 Dysphonia: Secondary | ICD-10-CM | POA: Diagnosis not present

## 2015-09-10 NOTE — Therapy (Signed)
[place] MAIN [facility] SERVICES [address] [place], [place], [address] Phone: [phone]   Fax:  [phone]  Speech Language Pathology Treatment  Patient Details  Name: Lorraine Wyatt MRN: [id number] Date of Birth: [birth year] Referring Provider: Doneta Public. Melrose Nakayama, MD  Encounter Date: 09/09/2015      End of Session - 09/10/15 1112    Visit Number 11   Number of Visits 17   Date for SLP Re-Evaluation 09/22/15   SLP Start Time 1500   SLP Stop Time  1558   SLP Time Calculation (min) 58 min   Activity Tolerance Patient tolerated treatment well      Past Medical History  Diagnosis Date  . Diabetes mellitus without complication ([place])   . Hyperlipidemia   . Hypertension   . Parkinson disease ([place])     History reviewed. No pertinent past surgical history.  There were no vitals filed for this visit.  Visit Diagnosis: Dysphonia      Subjective Assessment - 09/10/15 1113    Subjective Patient reports she has been incorporating her loud voice into daily communication and is getting some positive feedback.   Currently in Pain? No/denies               ADULT SLP TREATMENT - 09/10/15 0001    General Information   Behavior/Cognition Alert;Cooperative;Pleasant mood   Treatment Provided   Treatment provided Cognitive-Linquistic   Pain Assessment   Pain Assessment No/denies pain   Cognitive-Linquistic Treatment   Treatment focused on Voice   Skilled Treatment Daily Task #1 (Maximum sustained "ah"): Average 14 seconds, 82 dB. Daily Task 2 (Maximum fundamental frequency range): Highs: 15 high pitched "ah" given mod cues. Lows: 15 low pitched "ah" given mod cues. Daily task #3 (Maximum speech loudness drill of functional phrases): Average 74 dB.  Hierarchal speech loudness drill: Read paragraphs, 73 dB. Generate sentences, 74 dB.  Homework: assignments given.  Off the cuff remarks: average 70 dB; up to 76 dB given reminders to be loud.      Assessment / Recommendations / Plan   Plan Continue with current plan of care   Progression Toward Goals   Progression toward goals Progressing toward goals          SLP Education - 09/10/15 1112    Education provided Yes   Education Details LSVT-Loud   Person(s) Educated Patient   Methods Explanation;Demonstration;Verbal cues;Handout   Comprehension Verbalized understanding;Returned demonstration;Verbal cues required;Need further instruction            SLP Long Term Goals - 09/09/15 4034    SLP LONG TERM GOAL #1   Title The patient will complete Daily Tasks (Maximum duration "ah", High/Lows, and Functional Phrases) at average loudness of 80 dB and with loud, good quality voice.    Time 4   Period Weeks   Status Partially Met   SLP LONG TERM GOAL #2   Title The patient will complete Hierarchal Speech Loudness reading drills (words/phrases, sentences, and paragraph) at average 75 dB and with loud, good quality voice.     Time 4   Period Weeks   Status Partially Met   SLP LONG TERM GOAL #3   Title The patient will complete homework daily.   Time 4   Period Weeks   Status On-going   SLP LONG TERM GOAL #4   Title The patient will participate in conversation, maintaining average loudness of 75 dB and loud, good quality voice.   Time  4   Period Weeks   Status Partially Met          Plan - 09/10/15 1112    Clinical Impression Statement The patient is completing daily tasks and hierarchal speech drill tasks with loud, good quality voice given min SLP cues.  She is demonstrating generalization into conversational speech.   Speech Therapy Frequency 4x / week   Duration 4 weeks   Treatment/Interventions Other (comment)  LSVT-Loud   Potential to Achieve Goals Good   Potential Considerations Ability to learn/carryover information;Cooperation/participation level;Previous level of function;Severity of impairments;Family/community support   SLP Home Exercise Plan LSVT-Loud  homework and carryover exercises   Consulted and Agree with Plan of Care Patient          G-Codes - 09-28-15 1320    Functional Assessment Tool Used LSVT-LOUD protocol   Functional Limitations Voice   Voice Current Status (G9171) At least 20 percent but less than 40 percent impaired, limited or restricted   Voice Goal Status (L2751) At least 1 percent but less than 20 percent impaired, limited or restricted      Problem List There are no active problems to display for this patient.   Wynelle Cleveland 09/10/2015, 11:15 AM  [place] MAIN [facility] SERVICES [address] [place], [place], [address] Phone: [phone]   Fax:  [phone]   Name: JAMILYA SARRAZIN MRN: [id number] Date of Birth: 23-Feb-1946

## 2015-09-10 NOTE — Therapy (Signed)
[place] MAIN [facility] SERVICES [address] [place], [place], [address] Phone: [phone]   Fax:  [phone]  Speech Language Pathology Treatment  Patient Details  Name: Lorraine Wyatt MRN: [id number] Date of Birth: [birth year] Referring Provider: Doneta Public. Melrose Nakayama, MD  Encounter Date: 09/10/2015      End of Session - 09/10/15 1559    Visit Number 12   Number of Visits 17   Date for SLP Re-Evaluation 09/22/15   SLP Start Time 1500   SLP Stop Time  1600   SLP Time Calculation (min) 60 min   Activity Tolerance Patient tolerated treatment well      Past Medical History  Diagnosis Date  . Diabetes mellitus without complication ([place])   . Hyperlipidemia   . Hypertension   . Parkinson disease ([place])     History reviewed. No pertinent past surgical history.  There were no vitals filed for this visit.  Visit Diagnosis: Dysphonia      Subjective Assessment - 09/10/15 1559    Subjective Patient reports she has been incorporating her loud voice into daily communication and is getting some positive feedback.   Currently in Pain? No/denies               ADULT SLP TREATMENT - 09/10/15 1556    General Information   Behavior/Cognition Alert;Cooperative;Pleasant mood   Treatment Provided   Treatment provided Cognitive-Linquistic   Pain Assessment   Pain Assessment No/denies pain   Cognitive-Linquistic Treatment   Treatment focused on Voice   Skilled Treatment Daily Task #1 (Maximum sustained "ah"): Average 14 seconds, 82 dB. Daily Task 2 (Maximum fundamental frequency range): Highs: 15 high pitched "ah" given mod cues. Lows: 15 low pitched "ah" given mod cues. Daily task #3 (Maximum speech loudness drill of functional phrases): Average 74 dB.  Hierarchal speech loudness drill: Read paragraphs, 73 dB. Generate sentences, 74 dB.  Homework: assignments given.  Off the cuff remarks: average 70 dB; up to 76 dB given reminders to be loud.      Assessment / Recommendations / Plan   Plan Continue with current plan of care   Progression Toward Goals   Progression toward goals Progressing toward goals          SLP Education - 09/10/15 1559    Education provided Yes   Education Details LSVT-LOUD   Person(s) Educated Patient   Methods Explanation;Demonstration;Verbal cues;Handout   Comprehension Verbalized understanding;Returned demonstration;Verbal cues required;Need further instruction            SLP Long Term Goals - 09/09/15 2423    SLP LONG TERM GOAL #1   Title The patient will complete Daily Tasks (Maximum duration "ah", High/Lows, and Functional Phrases) at average loudness of 80 dB and with loud, good quality voice.    Time 4   Period Weeks   Status Partially Met   SLP LONG TERM GOAL #2   Title The patient will complete Hierarchal Speech Loudness reading drills (words/phrases, sentences, and paragraph) at average 75 dB and with loud, good quality voice.     Time 4   Period Weeks   Status Partially Met   SLP LONG TERM GOAL #3   Title The patient will complete homework daily.   Time 4   Period Weeks   Status On-going   SLP LONG TERM GOAL #4   Title The patient will participate in conversation, maintaining average loudness of 75 dB and loud, good quality voice.   Time  4   Period Weeks   Status Partially Met          Plan - 09/10/15 1603    Clinical Impression Statement The patient is completing daily tasks and hierarchal speech drill tasks with loud, good quality voice given min SLP cues.  She is demonstrating generalization into conversational speech.   Speech Therapy Frequency 4x / week   Duration 4 weeks   Treatment/Interventions Other (comment)  LSVT-LOUD   Potential to Achieve Goals Good   Potential Considerations Ability to learn/carryover information;Cooperation/participation level;Previous level of function;Severity of impairments;Family/community support   SLP Home Exercise Plan LSVT-Loud  homework and carryover exercises   Consulted and Agree with Plan of Care Patient          G-Codes - 09-11-15 1320    Functional Assessment Tool Used LSVT-LOUD protocol   Functional Limitations Voice   Voice Current Status (G9171) At least 20 percent but less than 40 percent impaired, limited or restricted   Voice Goal Status (T7711) At least 1 percent but less than 20 percent impaired, limited or restricted      Problem List There are no active problems to display for this patient.  [name], [place], [name] 09/10/2015, 4:04 PM  [place] MAIN [facility] SERVICES [address] [place], [place], [address] Phone: [phone]   Fax:  [phone]   Name: Lorraine Wyatt MRN: [id number] Date of Birth: [birth year]

## 2015-09-11 ENCOUNTER — Encounter: Payer: Self-pay | Admitting: Speech Pathology

## 2015-09-11 ENCOUNTER — Ambulatory Visit: Payer: Medicare Other | Admitting: Speech Pathology

## 2015-09-11 DIAGNOSIS — R490 Dysphonia: Secondary | ICD-10-CM

## 2015-09-11 NOTE — Therapy (Signed)
[place] MAIN [facility] SERVICES [address] [place], [place], [address] Phone: [phone]   Fax:  [phone]  Speech Language Pathology Treatment  Patient Details  Name: Lorraine Wyatt MRN: [id number] Date of Birth: 27-Oct-1945 Referring Provider: Doneta Public. Melrose Nakayama, MD  Encounter Date: 09/11/2015      End of Session - 09/11/15 1606    Visit Number 13   Number of Visits 17   Date for SLP Re-Evaluation 09/22/15   SLP Start Time 1500   SLP Stop Time  1600   SLP Time Calculation (min) 60 min   Activity Tolerance Patient tolerated treatment well      Past Medical History  Diagnosis Date  . Diabetes mellitus without complication ([place])   . Hyperlipidemia   . Hypertension   . Parkinson disease ([place])     History reviewed. No pertinent past surgical history.  There were no vitals filed for this visit.  Visit Diagnosis: Dysphonia      Subjective Assessment - 09/11/15 1606    Subjective Patient reports that people are not asking her to repeat herself.   Currently in Pain? No/denies               ADULT SLP TREATMENT - 09/11/15 0001    General Information   Behavior/Cognition Alert;Cooperative;Pleasant mood   Treatment Provided   Treatment provided Cognitive-Linquistic   Pain Assessment   Pain Assessment No/denies pain   Cognitive-Linquistic Treatment   Treatment focused on Voice   Skilled Treatment Daily Task #1 (Maximum sustained "ah"): Average 14 seconds, 82 dB. Daily Task 2 (Maximum fundamental frequency range): Highs: 15 high pitched "ah" given mod cues. Lows: 15 low pitched "ah" given mod cues. Daily task #3 (Maximum speech loudness drill of functional phrases): Average 74 dB.  Hierarchal speech loudness drill: Read paragraphs, 73 dB. Generate sentences, 74 dB.  Homework: assignments given.  Off the cuff remarks: average 70 dB; up to 76 dB given reminders to be loud.     Assessment / Recommendations / Plan   Plan Continue  with current plan of care   Progression Toward Goals   Progression toward goals Progressing toward goals          SLP Education - 09/10/15 1559    Education provided Yes   Education Details LSVT-LOUD   Person(s) Educated Patient   Methods Explanation;Demonstration;Verbal cues;Handout   Comprehension Verbalized understanding;Returned demonstration;Verbal cues required;Need further instruction            SLP Long Term Goals - 09/09/15 2897    SLP LONG TERM GOAL #1   Title The patient will complete Daily Tasks (Maximum duration "ah", High/Lows, and Functional Phrases) at average loudness of 80 dB and with loud, good quality voice.    Time 4   Period Weeks   Status Partially Met   SLP LONG TERM GOAL #2   Title The patient will complete Hierarchal Speech Loudness reading drills (words/phrases, sentences, and paragraph) at average 75 dB and with loud, good quality voice.     Time 4   Period Weeks   Status Partially Met   SLP LONG TERM GOAL #3   Title The patient will complete homework daily.   Time 4   Period Weeks   Status On-going   SLP LONG TERM GOAL #4   Title The patient will participate in conversation, maintaining average loudness of 75 dB and loud, good quality voice.   Time 4   Period Weeks  Status Partially Met          Plan - 09/11/15 1607    Clinical Impression Statement The patient is completing daily tasks and hierarchal speech drill tasks with loud, good quality voice given min SLP cues.  She is demonstrating generalization into conversational speech.   Speech Therapy Frequency 4x / week   Duration 4 weeks   Treatment/Interventions Other (comment)  LSVT-LOUD   Potential to Achieve Goals Good   Potential Considerations Ability to learn/carryover information;Cooperation/participation level;Previous level of function;Severity of impairments;Family/community support   SLP Home Exercise Plan LSVT-Loud homework and carryover exercises   Consulted and Agree  with Plan of Care Patient        Problem List There are no active problems to display for this patient.  [name], [place], [name] 09/11/2015, 4:08 PM  [place] MAIN [facility] SERVICES [address] [place], [place], [address] Phone: [phone]   Fax:  [phone]   Name: Lorraine Wyatt MRN: [id number] Date of Birth: Dec 10, 1945

## 2015-09-15 ENCOUNTER — Ambulatory Visit: Payer: Medicare Other | Admitting: Speech Pathology

## 2015-09-15 DIAGNOSIS — R490 Dysphonia: Secondary | ICD-10-CM

## 2015-09-16 ENCOUNTER — Ambulatory Visit: Payer: Medicare Other | Admitting: Speech Pathology

## 2015-09-16 ENCOUNTER — Encounter: Payer: Self-pay | Admitting: Speech Pathology

## 2015-09-16 DIAGNOSIS — R490 Dysphonia: Secondary | ICD-10-CM | POA: Diagnosis not present

## 2015-09-16 NOTE — Therapy (Signed)
[place] MAIN [facility] SERVICES [address] [place], [place], [address] Phone: [phone]   Fax:  [phone]  Speech Language Pathology Treatment  Patient Details  Name: Lorraine Wyatt MRN: [id number] Date of Birth: 11-16-1945 Referring Provider: Doneta Public. Melrose Nakayama, MD  Encounter Date: 09/15/2015      End of Session - 09/16/15 0824    Visit Number 14   Number of Visits 17   Date for SLP Re-Evaluation 09/22/15   SLP Start Time 1500   SLP Stop Time  1555   SLP Time Calculation (min) 55 min   Activity Tolerance Patient tolerated treatment well      Past Medical History  Diagnosis Date  . Diabetes mellitus without complication ([place])   . Hyperlipidemia   . Hypertension   . Parkinson disease ([place])     History reviewed. No pertinent past surgical history.  There were no vitals filed for this visit.  Visit Diagnosis: Dysphonia      Subjective Assessment - 09/16/15 0824    Subjective Patient reports that people are not asking her to repeat herself.   Currently in Pain? No/denies               ADULT SLP TREATMENT - 09/16/15 0001    General Information   Behavior/Cognition Alert;Cooperative;Pleasant mood   Treatment Provided   Treatment provided Cognitive-Linquistic   Pain Assessment   Pain Assessment No/denies pain   Cognitive-Linquistic Treatment   Treatment focused on Voice   Skilled Treatment Daily Task #1 (Maximum sustained "ah"): Average 14 seconds, 84 dB. Daily Task 2 (Maximum fundamental frequency range): Highs: 15 high pitched "ah" given min cues. Lows: 15 low pitched "ah" given min cues. Daily task #3 (Maximum speech loudness drill of functional phrases): Average 75 dB.  Hierarchal speech loudness drill: Read paragraphs, 75 dB.  Generate sentences to simple linguistic task, 73 dB.   Homework: assignments completed.  Off the cuff remarks: average 68 dB; up to 75 dB given reminders to be loud.     Assessment /  Recommendations / Plan   Plan Continue with current plan of care   Progression Toward Goals   Progression toward goals Progressing toward goals          SLP Education - 09/16/15 0824    Education provided Yes   Education Details LSVT-LOUD   Person(s) Educated Patient   Methods Explanation;Demonstration;Verbal cues;Handout   Comprehension Verbalized understanding;Returned demonstration;Verbal cues required;Need further instruction            SLP Long Term Goals - 09/09/15 7858    SLP LONG TERM GOAL #1   Title The patient will complete Daily Tasks (Maximum duration "ah", High/Lows, and Functional Phrases) at average loudness of 80 dB and with loud, good quality voice.    Time 4   Period Weeks   Status Partially Met   SLP LONG TERM GOAL #2   Title The patient will complete Hierarchal Speech Loudness reading drills (words/phrases, sentences, and paragraph) at average 75 dB and with loud, good quality voice.     Time 4   Period Weeks   Status Partially Met   SLP LONG TERM GOAL #3   Title The patient will complete homework daily.   Time 4   Period Weeks   Status On-going   SLP LONG TERM GOAL #4   Title The patient will participate in conversation, maintaining average loudness of 75 dB and loud, good quality voice.   Time 4  Period Weeks   Status Partially Met          Plan - 09/16/15 0825    Clinical Impression Statement The patient is completing daily tasks and hierarchal speech drill tasks with loud, good quality voice given min SLP cues.  She is demonstrating generalization into conversational speech.   Speech Therapy Frequency 4x / week   Duration 4 weeks   Treatment/Interventions Other (comment)  LSVT-LOUD   Potential to Achieve Goals Good   Potential Considerations Ability to learn/carryover information;Cooperation/participation level;Previous level of function;Severity of impairments;Family/community support   SLP Home Exercise Plan LSVT-Loud homework and  carryover exercises   Consulted and Agree with Plan of Care Patient        Problem List There are no active problems to display for this patient.  [name], MS/CCC- SLP  Lou Miner 09/16/2015, 8:26 AM  [place] MAIN [facility] SERVICES [address] [place], [place], [address] Phone: [phone]   Fax:  [phone]   Name: Lorraine Wyatt MRN: [id number] Date of Birth: 1946/01/28

## 2015-09-16 NOTE — Therapy (Signed)
[place] MAIN [facility] SERVICES [address] [place], [place], [address] Phone: [phone]   Fax:  [phone]  Speech Language Pathology Treatment  Patient Details  Name: ROSELLE NORTON MRN: [id number] Date of Birth: 1945-12-10 Referring Provider: Doneta Public. Mel[place] Nakayama, MD  Encounter Date: 09/16/2015      End of Session - 09/16/15 1556    Visit Number 15   Number of Visits 17   Date for SLP Re-Evaluation 09/22/15   SLP Start Time 1500   SLP Stop Time  63   SLP Time Calculation (min) 52 min   Activity Tolerance Patient tolerated treatment well      Past Medical History  Diagnosis Date  . Diabetes mellitus without complication ([place])   . Hyperlipidemia   . Hypertension   . Parkinson disease ([place])     History reviewed. No pertinent past surgical history.  There were no vitals filed for this visit.  Visit Diagnosis: Dysphonia      Subjective Assessment - 09/16/15 1556    Subjective Patient reports that people are not asking her to repeat herself.   Currently in Pain? No/denies               ADULT SLP TREATMENT - 09/16/15 1555    General Information   Behavior/Cognition Alert;Cooperative;Pleasant mood   Treatment Provided   Treatment provided Cognitive-Linquistic   Pain Assessment   Pain Assessment No/denies pain   Cognitive-Linquistic Treatment   Treatment focused on Voice   Skilled Treatment Daily Task #1 (Maximum sustained "ah"): Average 14 seconds, 84 dB. Daily Task 2 (Maximum fundamental frequency range): Highs: 15 high pitched "ah" given min cues. Lows: 15 low pitched "ah" given min cues. Daily task #3 (Maximum speech loudness drill of functional phrases): Average 75 dB.  Hierarchal speech loudness drill: Read paragraphs, 75 dB.  Generate sentences to simple linguistic task, 73 dB.   Homework: assignments completed.  Off the cuff remarks: average 68 dB; up to 75 dB given reminders to be loud.     Assessment /  Recommendations / Plan   Plan Continue with current plan of care   Progression Toward Goals   Progression toward goals Progressing toward goals          SLP Education - 09/16/15 1556    Education provided Yes   Education Details LSVT-LOUD   Person(s) Educated Patient   Methods Explanation;Demonstration;Verbal cues;Handout   Comprehension Verbalized understanding;Returned demonstration            SLP Long Term Goals - 09/09/15 2353    SLP LONG TERM GOAL #1   Title The patient will complete Daily Tasks (Maximum duration "ah", High/Lows, and Functional Phrases) at average loudness of 80 dB and with loud, good quality voice.    Time 4   Period Weeks   Status Partially Met   SLP LONG TERM GOAL #2   Title The patient will complete Hierarchal Speech Loudness reading drills (words/phrases, sentences, and paragraph) at average 75 dB and with loud, good quality voice.     Time 4   Period Weeks   Status Partially Met   SLP LONG TERM GOAL #3   Title The patient will complete homework daily.   Time 4   Period Weeks   Status On-going   SLP LONG TERM GOAL #4   Title The patient will participate in conversation, maintaining average loudness of 75 dB and loud, good quality voice.   Time 4   Period Weeks  Status Partially Met          Plan - 09/16/15 1556    Clinical Impression Statement The patient is completing daily tasks and hierarchal speech drill tasks with loud, good quality voice.  She is demonstrating generalization into conversational speech.   Speech Therapy Frequency 4x / week   Duration 4 weeks   Treatment/Interventions Other (comment)  LSVT-LOUD   Potential Considerations Ability to learn/carryover information;Cooperation/participation level;Previous level of function;Severity of impairments;Family/community support   SLP Home Exercise Plan LSVT-Loud homework and carryover exercises   Consulted and Agree with Plan of Care Patient        Problem List There  are no active problems to display for this patient.  [name], [place], [name] 09/16/2015, 3:57 PM  [place] MAIN [facility] SERVICES [address] [place], [place], [address] Phone: [phone]   Fax:  [phone]   Name: SHAIA PORATH MRN: [id number] Date of Birth: June 03, 1946

## 2015-09-17 ENCOUNTER — Ambulatory Visit: Payer: Medicare Other | Admitting: Speech Pathology

## 2015-09-17 DIAGNOSIS — R490 Dysphonia: Secondary | ICD-10-CM

## 2015-09-18 ENCOUNTER — Ambulatory Visit: Payer: Medicare Other | Admitting: Speech Pathology

## 2015-09-18 ENCOUNTER — Encounter: Payer: Self-pay | Admitting: Speech Pathology

## 2015-09-18 DIAGNOSIS — R490 Dysphonia: Secondary | ICD-10-CM | POA: Diagnosis not present

## 2015-09-18 NOTE — Therapy (Signed)
[place] MAIN [facility] SERVICES [address] [place], [place], [address] Phone: [phone]   Fax:  [phone]  Speech Language Pathology Treatment  Patient Details  Name: KARIS RILLING MRN: [id number] Date of Birth: 07/22/1945 Referring Provider: Doneta Public. Melrose Nakayama, MD  Encounter Date: 09/17/2015      End of Session - 09/18/15 0904    Visit Number 16   Number of Visits 17   Date for SLP Re-Evaluation 09/22/15   SLP Start Time 1500   SLP Stop Time  5277   SLP Time Calculation (min) 54 min   Activity Tolerance Patient tolerated treatment well      Past Medical History  Diagnosis Date  . Diabetes mellitus without complication ([place])   . Hyperlipidemia   . Hypertension   . Parkinson disease ([place])     History reviewed. No pertinent past surgical history.  There were no vitals filed for this visit.  Visit Diagnosis: Dysphonia      Subjective Assessment - 09/18/15 0904    Subjective Patient reports that people are not asking her to repeat herself.   Currently in Pain? No/denies               ADULT SLP TREATMENT - 09/18/15 0001    General Information   Behavior/Cognition Alert;Cooperative;Pleasant mood   Treatment Provided   Treatment provided Cognitive-Linquistic   Pain Assessment   Pain Assessment No/denies pain   Cognitive-Linquistic Treatment   Treatment focused on Voice   Skilled Treatment Daily Task #1 (Maximum sustained "ah"): Average 14 seconds, 84 dB. Daily Task 2 (Maximum fundamental frequency range): Highs: 15 high pitched "ah" given min cues. Lows: 15 low pitched "ah" given min cues. Daily task #3 (Maximum speech loudness drill of functional phrases): Average 75 dB.  Hierarchal speech loudness drill: Read paragraphs, 75 dB.  Generate sentences to simple linguistic task, 75 dB.   Homework: assignments completed.  Off the cuff remarks: average 75 dB given occasional reminders to be loud.     Assessment /  Recommendations / Plan   Plan Continue with current plan of care   Progression Toward Goals   Progression toward goals Progressing toward goals          SLP Education - 09/18/15 0904    Education provided Yes   Education Details LSVT-LOUD   Person(s) Educated Patient   Methods Explanation   Comprehension Verbalized understanding            SLP Long Term Goals - 09/09/15 8242    SLP LONG TERM GOAL #1   Title The patient will complete Daily Tasks (Maximum duration "ah", High/Lows, and Functional Phrases) at average loudness of 80 dB and with loud, good quality voice.    Time 4   Period Weeks   Status Partially Met   SLP LONG TERM GOAL #2   Title The patient will complete Hierarchal Speech Loudness reading drills (words/phrases, sentences, and paragraph) at average 75 dB and with loud, good quality voice.     Time 4   Period Weeks   Status Partially Met   SLP LONG TERM GOAL #3   Title The patient will complete homework daily.   Time 4   Period Weeks   Status On-going   SLP LONG TERM GOAL #4   Title The patient will participate in conversation, maintaining average loudness of 75 dB and loud, good quality voice.   Time 4   Period Weeks   Status Partially Met  Plan - 09/18/15 0904    Clinical Impression Statement The patient is completing daily tasks and hierarchal speech drill tasks with loud, good quality voice.  She is demonstrating generalization into conversational speech.   Speech Therapy Frequency 4x / week   Duration 4 weeks   Treatment/Interventions Other (comment)  LSVT-LOUD   Potential to Achieve Goals Good   Potential Considerations Ability to learn/carryover information;Cooperation/participation level;Previous level of function;Severity of impairments;Family/community support   SLP Home Exercise Plan LSVT-Loud homework and carryover exercises   Consulted and Agree with Plan of Care Patient        Problem List There are no active problems  to display for this patient.  [name], MS/CCC- SLP  Lou Miner 09/18/2015, 9:05 AM  [place] MAIN [facility] SERVICES [address] [place], [place], [address] Phone: [phone]   Fax:  [phone]   Name: SHERRIL SHIPMAN MRN: [id number] Date of Birth: 06-04-1946

## 2015-09-19 ENCOUNTER — Encounter: Payer: Self-pay | Admitting: Speech Pathology

## 2015-09-19 NOTE — Therapy (Signed)
[place] MAIN [facility] SERVICES [address] [place], [place], [address] Phone: [phone]   Fax:  [phone]  Speech Language Pathology Treatment/Discharge Summary  Patient Details  Name: Lorraine Wyatt MRN: [id number] Date of Birth: 1945-10-14 Referring Provider: Doneta Public. Melrose Nakayama, MD  Encounter Date: 09/18/2015      End of Session - 09/19/15 0756    Visit Number 17   Number of Visits 17   Date for SLP Re-Evaluation 09/22/15   SLP Start Time 1500   SLP Stop Time  1600   SLP Time Calculation (min) 60 min   Activity Tolerance Patient tolerated treatment well      Past Medical History  Diagnosis Date  . Diabetes mellitus without complication ([place])   . Hyperlipidemia   . Hypertension   . Parkinson disease ([place])     History reviewed. No pertinent past surgical history.  There were no vitals filed for this visit.  Visit Diagnosis: Dysphonia      Subjective Assessment - 09/19/15 0755    Subjective "People seem to hear me better!"   Currently in Pain? No/denies               ADULT SLP TREATMENT - 09/19/15 0001    General Information   Behavior/Cognition Alert;Cooperative;Pleasant mood   Treatment Provided   Treatment provided Cognitive-Linquistic   Pain Assessment   Pain Assessment No/denies pain   Cognitive-Linquistic Treatment   Treatment focused on Voice   Skilled Treatment Daily Task #1 (Maximum sustained "ah"): Average 14 seconds, 84 dB. Daily Task 2 (Maximum fundamental frequency range): Highs: 15 high pitched "ah" given min cues. Lows: 15 low pitched "ah" given min cues. Daily task #3 (Maximum speech loudness drill of functional phrases): Average 75 dB.  Hierarchal speech loudness drill: Read paragraphs, 75 dB.  Generate sentences to simple linguistic task, 75 dB.   Homework: assignments completed.  Off the cuff remarks: average 75 dB given occasional reminders to be loud.     Assessment / Recommendations / Plan    Plan Discharge SLP treatment due to (comment);All goals met   Progression Toward Goals   Progression toward goals Goals met, education completed, patient discharged from [place] Education - 09/19/15 0755    Education provided Yes   Education Details LSVT-LOUD forever homework   Person(s) Educated Patient   Methods Explanation   Comprehension Verbalized understanding            SLP Long Term Goals - 09/19/15 0757    SLP LONG TERM GOAL #1   Title The patient will complete Daily Tasks (Maximum duration "ah", High/Lows, and Functional Phrases) at average loudness of 80 dB and with loud, good quality voice.    Time 4   Period Weeks   Status Achieved   SLP LONG TERM GOAL #2   Title The patient will complete Hierarchal Speech Loudness reading drills (words/phrases, sentences, and paragraph) at average 75 dB and with loud, good quality voice.     Time 4   Period Weeks   Status Achieved   SLP LONG TERM GOAL #3   Title The patient will complete homework daily.   Time 4   Period Weeks   Status Achieved   SLP LONG TERM GOAL #4   Title The patient will participate in conversation, maintaining average loudness of 75 dB and loud, good quality voice.   Time 4   Period Weeks  Status Achieved          Plan - 13-Oct-2015 0756    Clinical Impression Statement The patient has completed the LSVT-LOUD program and has met her goals.  She is completing daily tasks and hierarchal speech drill tasks with loud, good quality voice. She is demonstrates generalization into conversational speech.   Treatment/Interventions Other (comment)  LSVT-LOUD   Potential to Achieve Goals Good   Potential Considerations Ability to learn/carryover information;Cooperation/participation level;Previous level of function;Severity of impairments;Family/community support   SLP Home Exercise Plan LSVT-Loud "forever" homework    Consulted and Agree with Plan of Care Patient          G-Codes -  10-13-2015 0758    Functional Assessment Tool Used LSVT-LOUD protocol   Functional Limitations Voice   Voice Current Status (G9171) At least 1 percent but less than 20 percent impaired, limited or restricted   Voice Goal Status (G9172) At least 1 percent but less than 20 percent impaired, limited or restricted   Voice Discharge Status (J0315) At least 1 percent but less than 20 percent impaired, limited or restricted      Problem List There are no active problems to display for this patient.  [name], MS/CCC- SLP  Lou Miner Oct 13, 2015, 7:59 AM  [place] MAIN [facility] SERVICES [address] [place], [place], [address] Phone: [phone]   Fax:  [phone]   Name: Lorraine Wyatt MRN: [id number] Date of Birth: May 15, 1946

## 2015-11-05 DIAGNOSIS — R32 Unspecified urinary incontinence: Secondary | ICD-10-CM | POA: Diagnosis present

## 2017-06-03 ENCOUNTER — Other Ambulatory Visit: Payer: Self-pay | Admitting: *Deleted

## 2017-12-12 ENCOUNTER — Ambulatory Visit
Admission: RE | Admit: 2017-12-12 | Discharge: 2017-12-12 | Disposition: A | Discharge status: Home / Self Care | Payer: Medicare Other | Source: Ambulatory Visit | Attending: Unknown Physician Specialty | Admitting: Unknown Physician Specialty

## 2017-12-12 ENCOUNTER — Encounter: Payer: Self-pay | Admitting: Anesthesiology

## 2017-12-12 ENCOUNTER — Ambulatory Visit: Payer: Medicare Other | Admitting: Certified Registered Nurse Anesthetist

## 2017-12-12 ENCOUNTER — Encounter
Admission: RE | Disposition: A | Discharge status: Home / Self Care | Payer: Self-pay | Source: Ambulatory Visit | Attending: Unknown Physician Specialty

## 2017-12-12 DIAGNOSIS — Z79899 Other long term (current) drug therapy: Secondary | ICD-10-CM | POA: Diagnosis not present

## 2017-12-12 DIAGNOSIS — K210 Gastro-esophageal reflux disease with esophagitis: Secondary | ICD-10-CM | POA: Insufficient documentation

## 2017-12-12 DIAGNOSIS — D509 Iron deficiency anemia, unspecified: Secondary | ICD-10-CM | POA: Insufficient documentation

## 2017-12-12 DIAGNOSIS — Z86010 Personal history of colonic polyps: Secondary | ICD-10-CM | POA: Insufficient documentation

## 2017-12-12 DIAGNOSIS — K640 First degree hemorrhoids: Secondary | ICD-10-CM | POA: Diagnosis not present

## 2017-12-12 DIAGNOSIS — Z7984 Long term (current) use of oral hypoglycemic drugs: Secondary | ICD-10-CM | POA: Diagnosis not present

## 2017-12-12 DIAGNOSIS — E119 Type 2 diabetes mellitus without complications: Secondary | ICD-10-CM | POA: Insufficient documentation

## 2017-12-12 DIAGNOSIS — K219 Gastro-esophageal reflux disease without esophagitis: Secondary | ICD-10-CM | POA: Diagnosis not present

## 2017-12-12 DIAGNOSIS — E785 Hyperlipidemia, unspecified: Secondary | ICD-10-CM | POA: Diagnosis not present

## 2017-12-12 DIAGNOSIS — K2950 Unspecified chronic gastritis without bleeding: Secondary | ICD-10-CM | POA: Diagnosis not present

## 2017-12-12 DIAGNOSIS — I10 Essential (primary) hypertension: Secondary | ICD-10-CM | POA: Insufficient documentation

## 2017-12-12 DIAGNOSIS — G20 Parkinson's disease: Secondary | ICD-10-CM | POA: Diagnosis not present

## 2017-12-12 HISTORY — PX: COLONOSCOPY WITH PROPOFOL: SHX5780

## 2017-12-12 HISTORY — PX: ESOPHAGOGASTRODUODENOSCOPY (EGD) WITH PROPOFOL: SHX5813

## 2017-12-12 LAB — GLUCOSE, CAPILLARY: Glucose-Capillary: 126 mg/dL — ABNORMAL HIGH (ref 65–99)

## 2017-12-12 SURGERY — COLONOSCOPY WITH PROPOFOL
Anesthesia: General

## 2017-12-12 MED ORDER — LIDOCAINE HCL (PF) 1 % IJ SOLN
INTRAMUSCULAR | Status: AC
  Administered 2017-12-12: 0.3 mL
  Filled 2017-12-12: qty 2

## 2017-12-12 MED ORDER — PHENYLEPHRINE HCL 10 MG/ML IJ SOLN
INTRAMUSCULAR | Status: DC | PRN
  Administered 2017-12-12 (×3): 100 ug via INTRAVENOUS

## 2017-12-12 MED ORDER — SODIUM CHLORIDE 0.9 % IV SOLN
INTRAVENOUS | Status: DC
  Administered 2017-12-12: 1000 mL via INTRAVENOUS

## 2017-12-12 MED ORDER — LIDOCAINE HCL (CARDIAC) PF 100 MG/5ML IV SOSY
PREFILLED_SYRINGE | INTRAVENOUS | Status: DC | PRN
  Administered 2017-12-12: 100 mg via INTRAVENOUS

## 2017-12-12 MED ORDER — PROPOFOL 10 MG/ML IV BOLUS
INTRAVENOUS | Status: DC | PRN
  Administered 2017-12-12: 50 mg via INTRAVENOUS
  Administered 2017-12-12: 10 mg via INTRAVENOUS

## 2017-12-12 MED ORDER — SODIUM CHLORIDE 0.9 % IV SOLN: INTRAVENOUS | Status: DC

## 2017-12-12 MED ORDER — PROPOFOL 500 MG/50ML IV EMUL
INTRAVENOUS | Status: DC | PRN
  Administered 2017-12-12: 150 ug/kg/min via INTRAVENOUS

## 2017-12-12 MED ORDER — PROPOFOL 500 MG/50ML IV EMUL
INTRAVENOUS | Status: AC
  Filled 2017-12-12: qty 50

## 2017-12-12 NOTE — H&P (Signed)
Primary Care Physician:  Jerl MinaHedrick, James, MD Primary Gastroenterologist:  Dr. Mechele CollinElliott  Pre-Procedure History & Physical: HPI:  Lorraine Wyatt is a 72 y.o. female is here for an endoscopy and colonoscopy.  Done for PH colon polyps and iron def anemia.   Past Medical History:  Diagnosis Date  . Diabetes mellitus without complication ([facility])   . Hyperlipidemia   . Hypertension   . Parkinson disease ([facility])     History reviewed. No pertinent surgical history.  Prior to Admission medications   Medication Sig Start Date End Date Taking? Authorizing Provider  carvedilol (COREG) 3.125 MG tablet Take 3.125 mg by mouth 2 (two) times daily with a meal.    [provider]  glipiZIDE (GLUCOTROL) 5 MG tablet Take by mouth daily before breakfast.    [provider]  losartan (COZAAR) 50 MG tablet Take 50 mg by mouth daily.    [provider]  metFORMIN (GLUCOPHAGE) 1000 MG tablet Take 1,000 mg by mouth 2 (two) times daily with a meal.    [provider]  omeprazole (PRILOSEC) 20 MG capsule Take 20 mg by mouth daily.    [provider]  pravastatin (PRAVACHOL) 40 MG tablet Take 40 mg by mouth daily.    [provider]  rOPINIRole (REQUIP) 0.25 MG tablet Take 0.25 mg by mouth 3 (three) times daily.    [provider]  selegiline (ELDEPRYL) 5 MG capsule Take 5 mg by mouth 2 (two) times daily before a meal.    [provider]  sitaGLIPtin (JANUVIA) 100 MG tablet Take 100 mg by mouth daily.    [provider]  triamterene-hydrochlorothiazide (MAXZIDE-25) 37.5-25 MG tablet Take 1 tablet by mouth daily.    [provider]    Allergies as of 10/27/2017 - Review Complete 09/19/2015  Allergen Reaction Noted  . Clarithromycin  08/22/2015  . Erythromycin ethylsuccinate  08/22/2015  . Trandolapril  08/22/2015    History reviewed. No pertinent family history.  Social History   Socioeconomic History  . Marital  status: Widowed    Spouse name: Not on file  . Number of children: Not on file  . Years of education: Not on file  . Highest education level: Not on file  Occupational History  . Not on file  Social Needs  . Financial resource strain: Not on file  . Food insecurity:    Worry: Not on file    Inability: Not on file  . Transportation needs:    Medical: Not on file    Non-medical: Not on file  Tobacco Use  . Smoking status: Never Smoker  Substance and Sexual Activity  . Alcohol use: Not on file  . Drug use: Not on file  . Sexual activity: Not on file  Lifestyle  . Physical activity:    Days per week: Not on file    Minutes per session: Not on file  . Stress: Not on file  Relationships  . Social connections:    Talks on phone: Not on file    Gets together: Not on file    Attends religious service: Not on file    Active member of club or organization: Not on file    Attends meetings of clubs or organizations: Not on file    Relationship status: Not on file  . Intimate partner violence:    Fear of current or ex partner: Not on file    Emotionally abused: Not on file    Physically abused:  Not on file    Forced sexual activity: Not on file  Other Topics Concern  . Not on file  Social History Narrative  . Not on file    Review of Systems: See HPI, otherwise negative ROS  Physical Exam: BP 136/77   Pulse 94   Temp (!) 96.9 F (36.1 C) (Tympanic)   Resp 16   Ht 5\' 4"  (1.626 m)   Wt 66.2 kg (146 lb)   SpO2 100%   BMI 25.06 kg/m  General:   Alert,  pleasant and cooperative in NAD Head:  Normocephalic and atraumatic. Neck:  Supple; no masses or thyromegaly. Lungs:  Clear throughout to auscultation.    Heart:  Regular rate and rhythm. Abdomen:  Soft, nontender and nondistended. Normal bowel sounds, without guarding, and without rebound.   Neurologic:  Alert and  oriented x4;  grossly normal neurologically.  Impression/Plan: [name] is here for an endoscopy  and colonoscopy to be performed for [facility] colon polyps and iron def anemia.  Risks, benefits, limitations, and alternatives regarding  endoscopy and colonoscopy have been reviewed with the patient.  Questions have been answered.  All parties agreeable.   Lynnae Prude, MD  12/12/2017, 12:31 PM

## 2017-12-12 NOTE — Op Note (Signed)
[place] [facility] Gastroenterology Patient Name: Lorraine SimmeringJulia Zavaleta Procedure Date: 12/12/2017 12:35 PM MRN: [id number] Account #: [id number] Date of Birth: June 17, 1946 Admit Type: Outpatient Age: [age] [facility] ENDO ROOM 3 Gender: Female Note Status: Finalized Procedure:            Colonoscopy Indications:          High risk colon cancer surveillance: Personal history                        of colonic polyps Providers:            Scot Junobert T. Cabela Pacifico, MD Referring MD:         Rhona LeavensJames F. Burnett ShengHedrick, MD (Referring MD) Medicines:            Propofol per Anesthesia Complications:        No immediate complications. Procedure:            Pre-Anesthesia Assessment:                       - After reviewing the risks and benefits, the patient                        was deemed in satisfactory condition to undergo the                        procedure.                       After obtaining informed consent, the colonoscope was                        passed under direct vision. Throughout the procedure,                        the patient's blood pressure, pulse, and oxygen                        saturations were monitored continuously. The                        Colonoscope was introduced through the anus and                        advanced to the the cecum, identified by appendiceal                        orifice and ileocecal valve. The colonoscopy was                        performed without difficulty. The patient tolerated the                        procedure well. The quality of the bowel preparation                        was excellent. Findings:      Internal hemorrhoids were found during endoscopy. The hemorrhoids were       small and Grade I (internal hemorrhoids that do not prolapse).      The exam was otherwise without abnormality. Impression:           -  Internal hemorrhoids.                       - The examination was otherwise normal.                       - No specimens  collected. Recommendation:       - Repeat colonoscopy in 5 years for adenoma                        surveillance. Scot Jun, MD 12/12/2017 1:18:37 PM This report has been signed electronically. Number of Addenda: 0 Note Initiated On: 12/12/2017 12:35 PM Scope Withdrawal Time: 0 hours 7 minutes 7 seconds  Total Procedure Duration: 0 hours 15 minutes 40 seconds       [facility]

## 2017-12-12 NOTE — Anesthesia Procedure Notes (Signed)
Performed by: Lafayette Dunlevy, CRNA Pre-anesthesia Checklist: Patient identified, Emergency Drugs available, Suction available, Patient being monitored and Timeout performed Patient Re-evaluated:Patient Re-evaluated prior to induction Oxygen Delivery Method: Nasal cannula Induction Type: IV induction       

## 2017-12-12 NOTE — Op Note (Signed)
[facility] Gastroenterology Patient Name: Lorraine Wyatt Procedure Date: 12/12/2017 12:36 PM MRN: [id number] Account #: [id number] Date of Birth: Mar 07, 1946 Admit Type: Outpatient Age: 72 Room: [facility] ENDO ROOM 3 Gender: Female Note Status: Finalized Procedure:            Upper GI endoscopy Indications:          Unexplained iron deficiency anemia Providers:            Scot Jun, MD Referring MD:         Rhona Leavens. Burnett Sheng, MD (Referring MD) Medicines:            Propofol per Anesthesia Complications:        No immediate complications. Procedure:            Pre-Anesthesia Assessment:                       - After reviewing the risks and benefits, the patient                        was deemed in satisfactory condition to undergo the                        procedure.                       After obtaining informed consent, the endoscope was                        passed under direct vision. Throughout the procedure,                        the patient's blood pressure, pulse, and oxygen                        saturations were monitored continuously. The Endoscope                        was introduced through the mouth, and advanced to the                        second part of duodenum. The upper GI endoscopy was                        accomplished without difficulty. The patient tolerated                        the procedure well. Findings:      LA Grade A (one or more mucosal breaks less than 5 mm, not extending       between tops of 2 mucosal folds) esophagitis with no bleeding was found       38 cm from the incisors.      Diffuse mildly erythematous mucosa without bleeding was found in the       gastric body. Biopsies were taken with a cold forceps for histology.       Biopsies were taken with a cold forceps for Helicobacter pylori testing.      The examined duodenum was normal. Impression:           - LA Grade A reflux esophagitis.                       -  Erythematous mucosa in the gastric body. Biopsied.                       - Normal examined duodenum. Recommendation:       - Await pathology results.                       - Perform a colonoscopy as previously scheduled. Scot Junobert T Ngan Qualls, MD 12/12/2017 12:55:58 PM This report has been signed electronically. Number of Addenda: 0 Note Initiated On: 12/12/2017 12:36 PM      [facility]

## 2017-12-12 NOTE — Transfer of Care (Signed)
Immediate Anesthesia Transfer of Care Note  Patient: Lorraine Wyatt  Procedure(s) Performed: COLONOSCOPY WITH PROPOFOL (N/A ) ESOPHAGOGASTRODUODENOSCOPY (EGD) WITH PROPOFOL (N/A )  Patient Location: PACU  Anesthesia Type:General  Level of Consciousness: awake, alert  and oriented  Airway & Oxygen Therapy: Patient Spontanous Breathing and Patient connected to nasal cannula oxygen  Post-op Assessment: Report given to RN and Post -op Vital signs reviewed and stable  Post vital signs: Reviewed and stable  Last Vitals:  Vitals Value Taken Time  BP 116/61 12/12/2017  1:20 PM  Temp    Pulse 81 12/12/2017  1:21 PM  Resp 16 12/12/2017  1:21 PM  SpO2 100 % 12/12/2017  1:21 PM  Vitals shown include unvalidated device data.  Last Pain:  Vitals:   12/12/17 1320  TempSrc: (P) Tympanic         Complications: No apparent anesthesia complications

## 2017-12-12 NOTE — Anesthesia Preprocedure Evaluation (Addendum)
Anesthesia Evaluation  Patient identified by MRN, date of birth, ID band Patient awake    Reviewed: Allergy & Precautions, H&P , NPO status , [name] Chart, lab work & pertinent test results, reviewed documented beta blocker date and time   History of Anesthesia Complications (+) PONV and history of anesthetic complications  Airway Mallampati: I  TM Distance: >3 FB Neck ROM: full    Dental  (+) Dental Advidsory Given Veneers in the front:   Pulmonary neg pulmonary ROS,           Cardiovascular Exercise Tolerance: Good hypertension, (-) angina(-) CAD, (-) Past MI, (-) Cardiac Stents and (-) CABG + dysrhythmias (-) Valvular Problems/Murmurs     Neuro/Psych neg Seizures Parkinson's Disease negative psych ROS   GI/Hepatic Neg liver ROS, GERD  ,  Endo/Other  diabetes  Renal/GU negative Renal ROS  negative genitourinary   Musculoskeletal   Abdominal   Peds  Hematology negative hematology ROS (+)   Anesthesia Other Findings Past Medical History: No date: Diabetes mellitus without complication (HCC) No date: Hyperlipidemia No date: Hypertension No date: Parkinson disease (HCC)   Reproductive/Obstetrics negative OB ROS                            Anesthesia Physical Anesthesia Plan  ASA: II  Anesthesia Plan: General   Post-op Pain Management:    Induction: Intravenous  PONV Risk Score and Plan: 4 or greater and Propofol infusion  Airway Management Planned: Nasal Cannula  Additional Equipment:   Intra-op Plan:   Post-operative Plan:   Informed Consent: I have reviewed the patients History and Physical, chart, labs and discussed the procedure including the risks, benefits and alternatives for the proposed anesthesia with the patient or authorized representative who has indicated his/her understanding and acceptance.   Dental Advisory Given  Plan Discussed with: Anesthesiologist,  CRNA and Surgeon  Anesthesia Plan Comments:        Anesthesia Quick Evaluation

## 2017-12-12 NOTE — Anesthesia Postprocedure Evaluation (Signed)
Anesthesia Post Note  Patient: Lorraine Wyatt  Procedure(s) Performed: COLONOSCOPY WITH PROPOFOL (N/A ) ESOPHAGOGASTRODUODENOSCOPY (EGD) WITH PROPOFOL (N/A )  Patient location during evaluation: Endoscopy Anesthesia Type: General Level of consciousness: awake and alert Pain management: pain level controlled Vital Signs Assessment: post-procedure vital signs reviewed and stable Respiratory status: spontaneous breathing, nonlabored ventilation, respiratory function stable and patient connected to nasal cannula oxygen Cardiovascular status: blood pressure returned to baseline and stable Postop Assessment: no apparent nausea or vomiting Anesthetic complications: no     Last Vitals:  Vitals:   12/12/17 1226 12/12/17 1320  BP: 136/77 116/76  Pulse: 94   Resp: 16   Temp: (!) 36.1 C (!) 35.9 C  SpO2: 100%     Last Pain:  Vitals:   12/12/17 1320  TempSrc: Tympanic                 Lenard SimmerAndrew Tonnie Stillman

## 2017-12-12 NOTE — Anesthesia Post-op Follow-up Note (Signed)
Anesthesia QCDR form completed.        

## 2017-12-13 ENCOUNTER — Encounter: Payer: Self-pay | Admitting: Unknown Physician Specialty

## 2017-12-13 LAB — SURGICAL PATHOLOGY

## 2018-07-18 ENCOUNTER — Inpatient Hospital Stay
Admission: EM | Admit: 2018-07-18 | Discharge: 2018-07-21 | DRG: 057 | Disposition: A | Discharge status: Home Health | Payer: Medicare Other | Attending: Specialist | Admitting: Specialist

## 2018-07-18 ENCOUNTER — Observation Stay: Payer: Medicare Other

## 2018-07-18 ENCOUNTER — Emergency Department: Payer: Medicare Other

## 2018-07-18 ENCOUNTER — Other Ambulatory Visit: Payer: Self-pay

## 2018-07-18 DIAGNOSIS — D649 Anemia, unspecified: Secondary | ICD-10-CM | POA: Diagnosis present

## 2018-07-18 DIAGNOSIS — I10 Essential (primary) hypertension: Secondary | ICD-10-CM | POA: Diagnosis present

## 2018-07-18 DIAGNOSIS — E119 Type 2 diabetes mellitus without complications: Secondary | ICD-10-CM | POA: Diagnosis present

## 2018-07-18 DIAGNOSIS — Z66 Do not resuscitate: Secondary | ICD-10-CM | POA: Diagnosis present

## 2018-07-18 DIAGNOSIS — G20 Parkinson's disease: Principal | ICD-10-CM | POA: Diagnosis present

## 2018-07-18 DIAGNOSIS — R110 Nausea: Secondary | ICD-10-CM | POA: Diagnosis not present

## 2018-07-18 DIAGNOSIS — Y92239 Unspecified place in hospital as the place of occurrence of the external cause: Secondary | ICD-10-CM | POA: Diagnosis not present

## 2018-07-18 DIAGNOSIS — T428X5A Adverse effect of antiparkinsonism drugs and other central muscle-tone depressants, initial encounter: Secondary | ICD-10-CM | POA: Diagnosis not present

## 2018-07-18 DIAGNOSIS — Z881 Allergy status to other antibiotic agents status: Secondary | ICD-10-CM

## 2018-07-18 DIAGNOSIS — G4752 REM sleep behavior disorder: Secondary | ICD-10-CM | POA: Diagnosis present

## 2018-07-18 DIAGNOSIS — Z888 Allergy status to other drugs, medicaments and biological substances status: Secondary | ICD-10-CM

## 2018-07-18 DIAGNOSIS — K219 Gastro-esophageal reflux disease without esophagitis: Secondary | ICD-10-CM | POA: Diagnosis present

## 2018-07-18 DIAGNOSIS — E876 Hypokalemia: Secondary | ICD-10-CM | POA: Diagnosis present

## 2018-07-18 DIAGNOSIS — Z7984 Long term (current) use of oral hypoglycemic drugs: Secondary | ICD-10-CM

## 2018-07-18 DIAGNOSIS — Z79899 Other long term (current) drug therapy: Secondary | ICD-10-CM

## 2018-07-18 DIAGNOSIS — R531 Weakness: Secondary | ICD-10-CM | POA: Diagnosis not present

## 2018-07-18 DIAGNOSIS — E785 Hyperlipidemia, unspecified: Secondary | ICD-10-CM | POA: Diagnosis present

## 2018-07-18 DIAGNOSIS — G459 Transient cerebral ischemic attack, unspecified: Secondary | ICD-10-CM | POA: Diagnosis present

## 2018-07-18 DIAGNOSIS — E7800 Pure hypercholesterolemia, unspecified: Secondary | ICD-10-CM | POA: Diagnosis present

## 2018-07-18 LAB — BASIC METABOLIC PANEL
ANION GAP: 11 (ref 5–15)
BUN: 25 mg/dL — ABNORMAL HIGH (ref 8–23)
CHLORIDE: 99 mmol/L (ref 98–111)
CO2: 24 mmol/L (ref 22–32)
Calcium: 8.8 mg/dL — ABNORMAL LOW (ref 8.9–10.3)
Creatinine, Ser: 1.2 mg/dL — ABNORMAL HIGH (ref 0.44–1.00)
GFR calc non Af Amer: 45 mL/min — ABNORMAL LOW (ref >60)
GFR, EST AFRICAN AMERICAN: 53 mL/min — AB (ref >60)
GLUCOSE: 140 mg/dL — AB (ref 70–99)
POTASSIUM: 3.5 mmol/L (ref 3.5–5.1)
Sodium: 134 mmol/L — ABNORMAL LOW (ref 135–145)

## 2018-07-18 LAB — CBC
HCT: 36.2 % (ref 36.0–46.0)
Hemoglobin: 11.6 g/dL — ABNORMAL LOW (ref 12.0–15.0)
MCH: 27.6 pg (ref 26.0–34.0)
MCHC: 32 g/dL (ref 30.0–36.0)
MCV: 86.2 fL (ref 80.0–100.0)
Platelets: 206 10*3/uL (ref 150–400)
RBC: 4.2 MIL/uL (ref 3.87–5.11)
RDW: 13.8 % (ref 11.5–15.5)
WBC: 4.3 10*3/uL (ref 4.0–10.5)
nRBC: 0 % (ref 0.0–0.2)

## 2018-07-18 LAB — URINALYSIS, COMPLETE (UACMP) WITH MICROSCOPIC
BILIRUBIN URINE: NEGATIVE
Bacteria, UA: NONE SEEN
Glucose, UA: NEGATIVE mg/dL
Hgb urine dipstick: NEGATIVE
Ketones, ur: NEGATIVE mg/dL
Leukocytes, UA: NEGATIVE
Nitrite: NEGATIVE
PH: 6 (ref 5.0–8.0)
Protein, ur: NEGATIVE mg/dL
SPECIFIC GRAVITY, URINE: 1.012 (ref 1.005–1.030)

## 2018-07-18 LAB — GLUCOSE, CAPILLARY
Glucose-Capillary: 103 mg/dL — ABNORMAL HIGH (ref 70–99)
Glucose-Capillary: 219 mg/dL — ABNORMAL HIGH (ref 70–99)

## 2018-07-18 MED ORDER — ASPIRIN 81 MG PO CHEW
324.0000 mg | CHEWABLE_TABLET | Freq: Once | ORAL | Status: AC
  Administered 2018-07-18: 324 mg via ORAL
  Filled 2018-07-18: qty 4

## 2018-07-18 MED ORDER — CARVEDILOL 3.125 MG PO TABS
3.1250 mg | ORAL_TABLET | Freq: Two times a day (BID) | ORAL | Status: DC
  Administered 2018-07-19 – 2018-07-21 (×4): 3.125 mg via ORAL
  Filled 2018-07-18 (×5): qty 1

## 2018-07-18 MED ORDER — PANTOPRAZOLE SODIUM 40 MG PO TBEC
40.0000 mg | DELAYED_RELEASE_TABLET | Freq: Every day | ORAL | Status: DC
  Administered 2018-07-19 – 2018-07-21 (×3): 40 mg via ORAL
  Filled 2018-07-18 (×3): qty 1

## 2018-07-18 MED ORDER — PRAVASTATIN SODIUM 20 MG PO TABS
40.0000 mg | ORAL_TABLET | Freq: Every day | ORAL | Status: DC
  Administered 2018-07-18 – 2018-07-20 (×3): 40 mg via ORAL
  Filled 2018-07-18 (×3): qty 2

## 2018-07-18 MED ORDER — STROKE: EARLY STAGES OF RECOVERY BOOK
Freq: Once | Status: AC
  Administered 2018-07-18: 19:00:00

## 2018-07-18 MED ORDER — SELEGILINE HCL 5 MG PO CAPS
5.0000 mg | ORAL_CAPSULE | Freq: Two times a day (BID) | ORAL | Status: DC
  Filled 2018-07-18: qty 1

## 2018-07-18 MED ORDER — INSULIN ASPART 100 UNIT/ML ~~LOC~~ SOLN
0.0000 [IU] | Freq: Every day | SUBCUTANEOUS | Status: DC
  Administered 2018-07-18 – 2018-07-20 (×2): 2 [IU] via SUBCUTANEOUS
  Filled 2018-07-18 (×2): qty 1

## 2018-07-18 MED ORDER — ACETAMINOPHEN 650 MG RE SUPP: 650.0000 mg | RECTAL | Status: DC | PRN

## 2018-07-18 MED ORDER — ACETAMINOPHEN 325 MG PO TABS
650.0000 mg | ORAL_TABLET | ORAL | Status: DC | PRN
  Administered 2018-07-19 – 2018-07-21 (×6): 650 mg via ORAL
  Filled 2018-07-18 (×6): qty 2

## 2018-07-18 MED ORDER — SODIUM CHLORIDE 0.9 % IV BOLUS
500.0000 mL | Freq: Once | INTRAVENOUS | Status: AC
  Administered 2018-07-18: 500 mL via INTRAVENOUS

## 2018-07-18 MED ORDER — SODIUM CHLORIDE 0.9 % IV SOLN
INTRAVENOUS | Status: DC
  Administered 2018-07-18 (×2): via INTRAVENOUS

## 2018-07-18 MED ORDER — INSULIN ASPART 100 UNIT/ML ~~LOC~~ SOLN
0.0000 [IU] | Freq: Three times a day (TID) | SUBCUTANEOUS | Status: DC
  Administered 2018-07-19: 1 [IU] via SUBCUTANEOUS
  Administered 2018-07-20: 18:00:00 2 [IU] via SUBCUTANEOUS
  Administered 2018-07-21: 08:00:00 1 [IU] via SUBCUTANEOUS
  Filled 2018-07-18 (×3): qty 1

## 2018-07-18 MED ORDER — SELEGILINE HCL 5 MG PO TABS
5.0000 mg | ORAL_TABLET | Freq: Two times a day (BID) | ORAL | Status: DC
  Filled 2018-07-18: qty 1

## 2018-07-18 MED ORDER — ENOXAPARIN SODIUM 40 MG/0.4ML ~~LOC~~ SOLN
40.0000 mg | SUBCUTANEOUS | Status: DC
  Administered 2018-07-18 – 2018-07-20 (×3): 40 mg via SUBCUTANEOUS
  Filled 2018-07-18 (×3): qty 0.4

## 2018-07-18 MED ORDER — ACETAMINOPHEN 500 MG PO TABS
1000.0000 mg | ORAL_TABLET | Freq: Once | ORAL | Status: AC
  Administered 2018-07-18: 1000 mg via ORAL
  Filled 2018-07-18: qty 2

## 2018-07-18 MED ORDER — GLIPIZIDE 5 MG PO TABS
5.0000 mg | ORAL_TABLET | Freq: Every day | ORAL | Status: DC
  Administered 2018-07-19 – 2018-07-21 (×3): 5 mg via ORAL
  Filled 2018-07-18 (×3): qty 1

## 2018-07-18 MED ORDER — ASPIRIN 325 MG PO TABS
325.0000 mg | ORAL_TABLET | Freq: Every day | ORAL | Status: DC
  Administered 2018-07-19 – 2018-07-21 (×3): 325 mg via ORAL
  Filled 2018-07-18 (×4): qty 1

## 2018-07-18 MED ORDER — ROPINIROLE HCL 0.25 MG PO TABS
0.2500 mg | ORAL_TABLET | Freq: Three times a day (TID) | ORAL | Status: DC
  Administered 2018-07-18: 0.25 mg via ORAL
  Filled 2018-07-18 (×2): qty 1

## 2018-07-18 MED ORDER — ACETAMINOPHEN 160 MG/5ML PO SOLN
650.0000 mg | ORAL | Status: DC | PRN
  Filled 2018-07-18: qty 20.3

## 2018-07-18 MED ORDER — LOSARTAN POTASSIUM 50 MG PO TABS
50.0000 mg | ORAL_TABLET | Freq: Every day | ORAL | Status: DC
  Administered 2018-07-19 – 2018-07-21 (×3): 50 mg via ORAL
  Filled 2018-07-18 (×3): qty 1

## 2018-07-18 MED ORDER — ASPIRIN 300 MG RE SUPP
300.0000 mg | Freq: Every day | RECTAL | Status: DC
  Filled 2018-07-18: qty 1

## 2018-07-18 NOTE — ED Notes (Signed)
Patient transported to MRI 

## 2018-07-18 NOTE — H&P (Signed)
[facility] [place] [facility]   PATIENT NAME: Lorraine Wyatt    MR#:  [id number]  DATE OF BIRTH:  1946/04/04  DATE OF ADMISSION:  07/18/2018  PRIMARY CARE PHYSICIAN: Jerl Mina, MD   REQUESTING/REFERRING PHYSICIAN:   CHIEF COMPLAINT:   Chief Complaint  Patient presents with  . Weakness    HISTORY OF PRESENT ILLNESS: Lorraine Wyatt  is a 73 y.o. female with a known history of type 2 diabetes mellitus, hypertension, hyperlipidemia, Parkinson's disease presented to the emergency room for weakness in the left leg.  This is been going on for the last 5 days.  Patient has difficulty in ambulation secondary to the weakness.  She states her strength has been decreased in the left leg.  Has tremors in upper extremities secondary to Parkinson's disease.  Follows up with neurologist as outpatient.  No history of any syncope or seizure.  She was worked up with CT head in the emergency room which showed no acute abnormality.  Pending MRI and MRA brain.  PAST MEDICAL HISTORY:   Past Medical History:  Diagnosis Date  . Diabetes mellitus without complication (HCC)   . Hyperlipidemia   . Hypertension   . Parkinson disease (HCC)     PAST SURGICAL HISTORY:  Past Surgical History:  Procedure Laterality Date  . COLONOSCOPY WITH PROPOFOL N/A 12/12/2017   Procedure: COLONOSCOPY WITH PROPOFOL;  Surgeon: Scot Jun, MD;  Location: [facility] ENDOSCOPY;  Service: Endoscopy;  Laterality: N/A;  . ESOPHAGOGASTRODUODENOSCOPY (EGD) WITH PROPOFOL N/A 12/12/2017   Procedure: ESOPHAGOGASTRODUODENOSCOPY (EGD) WITH PROPOFOL;  Surgeon: Scot Jun, MD;  Location: [facility] ENDOSCOPY;  Service: Endoscopy;  Laterality: N/A;    SOCIAL HISTORY:  Social History   Tobacco Use  . Smoking status: Never Smoker  . Smokeless tobacco: Never Used  Substance Use Topics  . Alcohol use: Not Currently    FAMILY HISTORY: No family history on file.  DRUG ALLERGIES:  Allergies  Allergen  Reactions  . Clarithromycin   . Erythromycin Ethylsuccinate   . Trandolapril     REVIEW OF SYSTEMS:   CONSTITUTIONAL: No fever, fatigue or weakness.  EYES: No blurred or double vision.  EARS, NOSE, AND THROAT: No tinnitus or ear pain.  RESPIRATORY: No cough, shortness of breath, wheezing or hemoptysis.  CARDIOVASCULAR: No chest pain, orthopnea, edema.  GASTROINTESTINAL: No nausea, vomiting, diarrhea or abdominal pain.  GENITOURINARY: No dysuria, hematuria.  ENDOCRINE: No polyuria, nocturia,  HEMATOLOGY: No anemia, easy bruising or bleeding SKIN: No rash or lesion. MUSCULOSKELETAL: weakness left leg NEUROLOGIC: No tingling, numbness, weakness.  PSYCHIATRY: No anxiety or depression.   MEDICATIONS AT HOME:  Prior to Admission medications   Medication Sig Start Date End Date Taking? Authorizing Provider  carvedilol (COREG) 3.125 MG tablet Take 3.125 mg by mouth 2 (two) times daily with a meal.    [provider]  glipiZIDE (GLUCOTROL) 5 MG tablet Take by mouth daily before breakfast.    [provider]  losartan (COZAAR) 50 MG tablet Take 50 mg by mouth daily.    [provider]  metFORMIN (GLUCOPHAGE) 1000 MG tablet Take 1,000 mg by mouth 2 (two) times daily with a meal.    [provider]  omeprazole (PRILOSEC) 20 MG capsule Take 20 mg by mouth daily.    [provider]  pravastatin (PRAVACHOL) 40 MG tablet Take 40 mg by mouth daily.    [provider]  rOPINIRole (REQUIP) 0.25 MG tablet Take 0.25 mg by mouth  3 (three) times daily.    [provider]  selegiline (ELDEPRYL) 5 MG capsule Take 5 mg by mouth 2 (two) times daily before a meal.    [provider]  sitaGLIPtin (JANUVIA) 100 MG tablet Take 100 mg by mouth daily.    [provider]  triamterene-hydrochlorothiazide (MAXZIDE-25) 37.5-25 MG tablet Take 1 tablet by mouth daily.    [provider]      PHYSICAL EXAMINATION:   VITAL  SIGNS: Blood pressure 121/60, pulse 91, temperature 99.1 F (37.3 C), temperature source Oral, resp. rate 18, height 5\' 3"  (1.6 m), weight 63.5 kg, SpO2 98 %.  GENERAL:  73 y.o.-year-old patient lying in the bed with no acute distress.  EYES: Pupils equal, round, reactive to light and accommodation. No scleral icterus. Extraocular muscles intact.  HEENT: Head atraumatic, normocephalic. Oropharynx and nasopharynx clear.  NECK:  Supple, no jugular venous distention. No thyroid enlargement, no tenderness.  LUNGS: Normal breath sounds bilaterally, no wheezing, rales,rhonchi or crepitation. No use of accessory muscles of respiration.  CARDIOVASCULAR: S1, S2 normal. No murmurs, rubs, or gallops.  ABDOMEN: Soft, nontender, nondistended. Bowel sounds present. No organomegaly or mass.  EXTREMITIES: No pedal edema, cyanosis, or clubbing.  NEUROLOGIC: Cranial nerves II through XII are intact. Muscle strength 3/5 in left leg and muscle strength 5/5 in all other extremities. Sensation intact. Gait not checked.  PSYCHIATRIC: The patient is alert and oriented x 3.  SKIN: No obvious rash, lesion, or ulcer.   LABORATORY PANEL:   CBC Recent Labs  Lab 07/18/18 1350  WBC 4.3  HGB 11.6*  HCT 36.2  PLT 206  MCV 86.2  MCH 27.6  MCHC 32.0  RDW 13.8   ------------------------------------------------------------------------------------------------------------------  Chemistries  Recent Labs  Lab 07/18/18 1350  NA 134*  K 3.5  CL 99  CO2 24  GLUCOSE 140*  BUN 25*  CREATININE 1.20*  CALCIUM 8.8*   ------------------------------------------------------------------------------------------------------------------ estimated creatinine clearance is 38.6 mL/min (A) (by C-G formula based on SCr of 1.2 mg/dL (H)). ------------------------------------------------------------------------------------------------------------------ No results for input(s): TSH, T4TOTAL, T3FREE, THYROIDAB in the last 72  hours.  Invalid input(s): FREET3   Coagulation profile No results for input(s): INR, PROTIME in the last 168 hours. ------------------------------------------------------------------------------------------------------------------- No results for input(s): DDIMER in the last 72 hours. -------------------------------------------------------------------------------------------------------------------  Cardiac Enzymes No results for input(s): CKMB, TROPONINI, MYOGLOBIN in the last 168 hours.  Invalid input(s): CK ------------------------------------------------------------------------------------------------------------------ Invalid input(s): POCBNP  ---------------------------------------------------------------------------------------------------------------  Urinalysis    Component Value Date/Time   COLORURINE YELLOW (A) 07/18/2018 1533   APPEARANCEUR CLEAR (A) 07/18/2018 1533   LABSPEC 1.012 07/18/2018 1533   PHURINE 6.0 07/18/2018 1533   GLUCOSEU NEGATIVE 07/18/2018 1533   HGBUR NEGATIVE 07/18/2018 1533   BILIRUBINUR NEGATIVE 07/18/2018 1533   KETONESUR NEGATIVE 07/18/2018 1533   PROTEINUR NEGATIVE 07/18/2018 1533   NITRITE NEGATIVE 07/18/2018 1533   LEUKOCYTESUR NEGATIVE 07/18/2018 1533     RADIOLOGY: Ct Head Wo Contrast  Result Date: 07/18/2018 CLINICAL DATA:  Focal neural deficit of greater than 6 hours, history of Parkinson's disease EXAM: CT HEAD WITHOUT CONTRAST TECHNIQUE: Contiguous axial images were obtained from the base of the skull through the vertex without intravenous contrast. COMPARISON:  None. FINDINGS: Brain: The ventricular system is normal in size and configuration, and there is only mild cortical atrophy present. The septum is midline in position. The fourth ventricle and basilar cisterns are unremarkable. No hemorrhage, mass lesion, or acute infarction is seen. There may be very mild small vessel ischemic change  throughout the periventricular white  matter. Vascular: No vascular abnormality is seen on this unenhanced study. Skull: On bone window images, no calvarial abnormality is seen. Sinuses/Orbits: The paranasal sinuses appear well pneumatized. Other: None. IMPRESSION: Minimal cortical atrophy and perhaps mild small vessel ischemic change throughout the periventricular white matter. No acute intracranial abnormality. Electronically Signed   By: Dwyane Dee M.D.   On: 07/18/2018 14:37    EKG: Orders placed or performed during the hospital encounter of 07/18/18  . ED EKG  . ED EKG    IMPRESSION AND PLAN:  73 year old female patient with history of hypertension, type 2 diabetes mellitus, hyperlipidemia Parkinson's disease presented to the emergency room for left leg weakness which has been going on for the 5 days  -Left leg weakness Evaluate for CVA Check MRI and MRA brain, carotid ultrasound and echocardiogram Neurology consult Physical therapy evaluation Start oral aspirin Resume statin medication  -Type 2 diabetes mellitus Oral hypoglycemic drugs Diabetic diet Sliding scale coverage with insulin  -Parkinson's disease Resume meds Neurology follow-up  -Hyperlipidemia Continue statin medication  All the records are reviewed and case discussed with ED provider. Management plans discussed with the patient, family and they are in agreement.  CODE STATUS:DNR    Code Status Orders  (From admission, onward)         Start     Ordered   07/18/18 1645  Do not attempt resuscitation (DNR)  Continuous    Question Answer Comment  In the event of cardiac or respiratory ARREST Do not call a "code blue"   In the event of cardiac or respiratory ARREST Do not perform Intubation, CPR, defibrillation or ACLS   In the event of cardiac or respiratory ARREST Use medication by any route, position, wound care, and other measures to relive pain and suffering. May use oxygen, suction and manual treatment of airway obstruction as needed  for comfort.      07/18/18 1644        Code Status History    This patient has a current code status but no historical code status.    Advance Directive Documentation     Most Recent Value  Type of Advance Directive  Healthcare Power of Attorney, Living will, Out of facility DNR (pink MOST or yellow form)  Pre-existing out of facility DNR order (yellow form or pink MOST form)  -  "MOST" Form in Place?  -       TOTAL TIME TAKING CARE OF THIS PATIENT: 52 minutes.    Ihor Austin M.D on 07/18/2018 at 4:48 PM  Between 7am to 6pm - Pager - [phone]  After 6pm go to [email or web address] - password [email or web address] [facility]  [facility]  Office  [phone]  CC: Primary care physician; Jerl Mina, MD

## 2018-07-18 NOTE — Progress Notes (Signed)
Advanced care plan.  Purpose of the Encounter: CODE STATUS  Parties in Attendance:Patient and family  [name] Decision Capacity:Good  Subjective/[name] story: Presented to ER with left leg weakness  Objective/Medical story Needs evaluation for cva with mri brain, mra brain and carotid ultrasound Check echocardiogram. Needs neurology evaluation  Goals of care determination:  Patient wants everything done which includes cpr, intubation and ventilator if need arises  CODE STATUS: DNR  Time spent discussing advanced care planning: 16 minutes

## 2018-07-18 NOTE — ED Provider Notes (Signed)
[facility] Emergency Department Provider Note    First MD Initiated Contact with Patient 07/18/18 1401     (approximate)  I have reviewed the triage vital signs and the nursing notes.   HISTORY  Chief Complaint Weakness    HPI Lorraine Wyatt is a 73 y.o. female history of diabetes, high cholesterol as well as hypertension and Parkinson's disease presents the ER for progressive worsening weakness particular the left lower extremity over the past several days.  States that she was recently started on [place] and felt like her symptoms and Parkinson tremor improved but she was having headaches and nausea related to that medication so 5 days ago she stopped it.  Since then she is not having any headaches or nausea but feels that the weakness has gotten significantly worse.  Denies any history of stroke or TIA.  No history of dysrhythmia.  No recent fevers neck pain or shortness of breath.    Past Medical History:  Diagnosis Date  . Diabetes mellitus without complication (HCC)   . Hyperlipidemia   . Hypertension   . Parkinson disease (HCC)    History reviewed. No pertinent family history. Past Surgical History:  Procedure Laterality Date  . COLONOSCOPY WITH PROPOFOL N/A 12/12/2017   Procedure: COLONOSCOPY WITH PROPOFOL;  Surgeon: Scot Jun, MD;  Location[facility] ENDOSCOPY;  Service: Endoscopy;  Laterality: N/A;  . ESOPHAGOGASTRODUODENOSCOPY (EGD) WITH PROPOFOL N/A 12/12/2017   Procedure: ESOPHAGOGASTRODUODENOSCOPY (EGD) WITH PROPOFOL;  Surgeon: Scot Jun, MD;  Location: [facility] ENDOSCOPY;  Service: Endoscopy;  Laterality: N/A;   Patient Active Problem List   Diagnosis Date Noted  . TIA (transient ischemic attack) 07/18/2018      Prior to Admission medications   Medication Sig Start Date End Date Taking? Authorizing Provider  carvedilol (COREG) 3.125 MG tablet Take 3.125 mg by mouth 2 (two) times daily with a meal.   Yes [provider]  glipiZIDE (GLUCOTROL) 5 MG tablet Take 5 mg by mouth daily before breakfast.    Yes [provider]  losartan (COZAAR) 50 MG tablet Take 50 mg by mouth daily.   Yes [provider]  metFORMIN (GLUCOPHAGE) 1000 MG tablet Take 1,000 mg by mouth 2 (two) times daily with a meal.   Yes [provider]  omeprazole (PRILOSEC) 20 MG capsule Take 20 mg by mouth daily.   Yes [provider]  pravastatin (PRAVACHOL) 40 MG tablet Take 40 mg by mouth daily.   Yes [provider]  rOPINIRole (REQUIP) 0.25 MG tablet Take 0.25 mg by mouth 3 (three) times daily.   Yes [provider]  selegiline (ELDEPRYL) 5 MG capsule Take 5 mg by mouth daily.    Yes [provider]  sitaGLIPtin (JANUVIA) 100 MG tablet Take 100 mg by mouth daily.   Yes [provider]  triamterene-hydrochlorothiazide (MAXZIDE-25) 37.5-25 MG tablet Take 1 tablet by mouth daily.   Yes [provider]    Allergies Clarithromycin; Erythromycin ethylsuccinate; and Trandolapril    Social History Social History   Tobacco Use  . Smoking status: Never Smoker  . Smokeless tobacco: Never Used  Substance Use Topics  . Alcohol use: Not Currently  . Drug use: Never    Review of Systems Patient denies headaches, rhinorrhea, blurry vision, numbness, shortness of breath, chest pain, edema, cough, abdominal pain, nausea, vomiting, diarrhea, dysuria, fevers, rashes or hallucinations unless otherwise stated above in HPI. ____________________________________________   PHYSICAL EXAM:  VITAL SIGNS: Vitals:  07/18/18 1715 07/18/18 1831  BP:  113/64  Pulse: 88 83  Resp: 20   Temp:  98 F (36.7 C)  SpO2: 95% 95%    Constitutional: Alert and oriented.  Eyes: Conjunctivae are normal.  Head: Atraumatic. Nose: No congestion/rhinnorhea. Mouth/Throat: Mucous membranes are moist.   Neck: No stridor. Painless ROM.  Cardiovascular: Normal rate, regular rhythm.  Grossly normal heart sounds.  Good peripheral circulation. Respiratory: Normal respiratory effort.  No retractions. Lungs CTAB. Gastrointestinal: Soft and nontender. No distention. No abdominal bruits. No CVA tenderness. Genitourinary:  Musculoskeletal: No lower extremity tenderness nor edema.  No joint effusions. Neurologic:  Normal speech and language.  CNi.  lue resting tremor.  Decreased strenght to lle.  Sensation intact.  Unable to hold leg raised against gravity. No facial droop Skin:  Skin is warm, dry and intact. No rash noted. Psychiatric: Mood and affect are normal. Speech and behavior are normal.  ____________________________________________   LABS (all labs ordered are listed, but only abnormal results are displayed)  Results for orders placed or performed during the hospital encounter of 07/18/18 (from the past 24 hour(s))  Basic metabolic panel     Status: Abnormal   Collection Time: 07/18/18  1:50 PM  Result Value Ref Range   Sodium 134 (L) 135 - 145 mmol/L   Potassium 3.5 3.5 - 5.1 mmol/L   Chloride 99 98 - 111 mmol/L   CO2 24 22 - 32 mmol/L   Glucose, Bld 140 (H) 70 - 99 mg/dL   BUN 25 (H) 8 - 23 mg/dL   Creatinine, Ser 1.611.20 (H) 0.44 - 1.00 mg/dL   Calcium 8.8 (L) 8.9 - 10.3 mg/dL   GFR calc non Af Amer 45 (L) >60 mL/min   GFR calc Af Amer 53 (L) >60 mL/min   Anion gap 11 5 - 15  CBC     Status: Abnormal   Collection Time: 07/18/18  1:50 PM  Result Value Ref Range   WBC 4.3 4.0 - 10.5 K/uL   RBC 4.20 3.87 - 5.11 MIL/uL   Hemoglobin 11.6 (L) 12.0 - 15.0 g/dL   HCT 09.636.2 04.536.0 - 40.946.0 %   MCV 86.2 80.0 - 100.0 fL   MCH 27.6 26.0 - 34.0 pg   MCHC 32.0 30.0 - 36.0 g/dL   RDW 81.113.8 91.411.5 - 78.215.5 %   Platelets 206 150 - 400 K/uL   nRBC 0.0 0.0 - 0.2 %  Urinalysis, Complete w Microscopic     Status: Abnormal   Collection Time: 07/18/18  3:33 PM  Result Value Ref Range   Color, Urine YELLOW (A) YELLOW   APPearance CLEAR (A) CLEAR   Specific Gravity, Urine 1.012  1.005 - 1.030   pH 6.0 5.0 - 8.0   Glucose, UA NEGATIVE NEGATIVE mg/dL   Hgb urine dipstick NEGATIVE NEGATIVE   Bilirubin Urine NEGATIVE NEGATIVE   Ketones, ur NEGATIVE NEGATIVE mg/dL   Protein, ur NEGATIVE NEGATIVE mg/dL   Nitrite NEGATIVE NEGATIVE   Leukocytes, UA NEGATIVE NEGATIVE   RBC / HPF 0-5 0 - 5 RBC/hpf   WBC, UA 0-5 0 - 5 WBC/hpf   Bacteria, UA NONE SEEN NONE SEEN   Squamous Epithelial / LPF 0-5 0 - 5   Mucus PRESENT    Non Squamous Epithelial PRESENT (A) NONE SEEN  Glucose, capillary     Status: Abnormal   Collection Time: 07/18/18  5:07 PM  Result Value Ref Range   Glucose-Capillary 103 (H) 70 - 99 mg/dL  ____________________________________________  EKG My review and personal interpretation at Time: 13:42   Indication: weakness  Rate: 95  Rhythm: sinus Axis: left Other: normal intervals, no stemi ____________________________________________  RADIOLOGY  I personally reviewed all radiographic images ordered to evaluate for the above acute complaints and reviewed radiology reports and findings.  These findings were personally discussed with the patient.  Please see medical record for radiology report.  ____________________________________________   PROCEDURES  Procedure(s) performed:  Procedures    Critical Care performed: no ____________________________________________   INITIAL IMPRESSION / ASSESSMENT AND PLAN / ED COURSE  Pertinent labs & imaging results that were available during my care of the patient were reviewed by me and considered in my medical decision making (see chart for details).   DDX: cva, tia, hypoglycemia, dehydration, electrolyte abnormality, dissection, sepsis   Lorraine Wyatt is a 73 y.o. who presents to the ED with symptoms as described above.  Exam as above.  The patient will be placed on continuous pulse oximetry and telemetry for monitoring.  Laboratory evaluation will be sent to evaluate for the above complaints.  Him started  several days ago so seems more subacute in nature not consistent with acute code stroke but imaging and blood work will be ordered to evaluate for the above differential.  May be related to medication   Clinical Course as of Jul 19 1931  Tue Jul 18, 2018  1558 Reviewed CT imaging with patient and family.  Will order MRI.  Will consult with neurology.   [PR]    Clinical Course User Index [PR] Willy Eddy, MD    Discussed case with neurology who agrees with plan for admission the hospital for stroke evaluation and further medical management given her risk factors.   As part of my medical decision making, I reviewed the following data within the electronic MEDICAL RECORD NUMBER Nursing notes reviewed and incorporated, Labs reviewed, notes from prior ED visits.  ____________________________________________   FINAL CLINICAL IMPRESSION(S) / ED DIAGNOSES  Final diagnoses:  Acute left-sided weakness      NEW MEDICATIONS STARTED DURING THIS VISIT:  Current Discharge Medication List       Note:  This document was prepared using Dragon voice recognition software and may include unintentional dictation errors.    Willy Eddy, MD 07/18/18 [phone]

## 2018-07-18 NOTE — ED Triage Notes (Signed)
Pt to ED via EMS from home.Pt states she was recently prescribed a new medication, terazosin for parkinson's disease, she was instructed to start out taking 1mg  for a week and increasing 1mg  every week up to 5mg . When pt reached 5mg  she was unable to tolerate medication it made her feel nauseous and have headaches so pt stopped taking medication has been off for 10 days. Pt states she now feels weak. VSS. NAD.

## 2018-07-19 ENCOUNTER — Observation Stay: Admit: 2018-07-19 | Payer: Medicare Other

## 2018-07-19 ENCOUNTER — Observation Stay: Payer: Medicare Other

## 2018-07-19 DIAGNOSIS — R531 Weakness: Secondary | ICD-10-CM

## 2018-07-19 LAB — LIPID PANEL
Cholesterol: 140 mg/dL (ref 0–200)
HDL: 44 mg/dL (ref >40)
LDL Cholesterol: 68 mg/dL (ref 0–99)
Total CHOL/HDL Ratio: 3.2 RATIO
Triglycerides: 138 mg/dL (ref <150)
VLDL: 28 mg/dL (ref 0–40)

## 2018-07-19 LAB — GLUCOSE, CAPILLARY
Glucose-Capillary: 106 mg/dL — ABNORMAL HIGH (ref 70–99)
Glucose-Capillary: 136 mg/dL — ABNORMAL HIGH (ref 70–99)
Glucose-Capillary: 110 mg/dL — ABNORMAL HIGH (ref 70–99)
Glucose-Capillary: 65 mg/dL — ABNORMAL LOW (ref 70–99)
Glucose-Capillary: 91 mg/dL (ref 70–99)

## 2018-07-19 MED ORDER — CARBIDOPA-LEVODOPA 25-250 MG PO TABS
1.0000 | ORAL_TABLET | Freq: Three times a day (TID) | ORAL | Status: DC
  Administered 2018-07-19 – 2018-07-21 (×8): 1 via ORAL
  Filled 2018-07-19 (×9): qty 1

## 2018-07-19 MED ORDER — ROPINIROLE HCL 0.25 MG PO TABS
0.2500 mg | ORAL_TABLET | Freq: Three times a day (TID) | ORAL | Status: DC
  Administered 2018-07-19 – 2018-07-21 (×7): 0.25 mg via ORAL
  Filled 2018-07-19 (×8): qty 1

## 2018-07-19 MED ORDER — ROPINIROLE HCL 0.25 MG PO TABS
0.2500 mg | ORAL_TABLET | Freq: Four times a day (QID) | ORAL | Status: DC
  Administered 2018-07-19: 0.25 mg via ORAL
  Filled 2018-07-19 (×4): qty 1

## 2018-07-19 NOTE — Evaluation (Signed)
Occupational Therapy Evaluation Patient Details Name: Lorraine Wyatt MRN: [id number] DOB: [birth year] Today's Date: 07/19/2018    History of Present Illness Pt. is a 73 y.o. female who was admitted to [facility] with progressive left sided weakness, stiffness, and worsening of Parkinsons Disease. Pt. PMHx includes: Type II DM, HTN, Hyperlipidemia, and Tremors in the LUE.    Clinical Impression   Pt. Presents with weakness, tremors, limited activity tolerance, and limited functional mobility which limits the ability to complete basic ADL and IADL functioning. Pt. Resides at home alone. Pt. Was independent with ADLs, and IADL functioning: including meal preparation, and medication management. Pt. Was able to drive. Pt. Has a supportive son who is able to assist pt. As needed. Pt. was UE tremors, reporting the left is worse than the right. Pt. uses the right UE to complete ADL tasks using the left hand to assist as needed. Pt. could benefit from OT services for ADL training, A/E training, neuromuscular re-education, and pt. education about home modification, and DME. Pt. would benefit from SNF level of care upon discharge. Pt. Could benefit from follow-up OT services at discharge.    Follow Up Recommendations  SNF    Equipment Recommendations  3 in 1 bedside commode;Tub/shower seat    Recommendations for Other Services       Precautions / Restrictions Precautions Precautions: None Restrictions Weight Bearing Restrictions: No      Mobility Bed Mobility Overal bed mobility: Needs Assistance Bed Mobility: Sit to Supine       Sit to supine: Mod assist Assist required for the LLE, and to scoot hips forward once in sitting.      Transfers    Deferred                  Balance                                           ADL either performed or assessed with clinical judgement   ADL Overall ADL's : Needs assistance/impaired Eating/Feeding: Set  up;Independent(Using her right hand)   Grooming: Set up;Independent   Upper Body Bathing: Minimal assistance;Set up   Lower Body Bathing: Set up;Maximal assistance   Upper Body Dressing : Set up;Moderate assistance   Lower Body Dressing: Set up;Maximal assistance                       Vision Baseline Vision/History: Wears glasses Wears Glasses: Reading only Patient Visual Report: No change from baseline       Perception     Praxis      Pertinent Vitals/Pain Pain Assessment: No/denies pain     Hand Dominance Right   Extremity/Trunk Assessment Upper Extremity Assessment Upper Extremity Assessment: Generalized weakness           Communication Communication Communication: No difficulties   Cognition Arousal/Alertness: Awake/alert Behavior During Therapy: WFL for tasks assessed/performed Overall Cognitive Status: Within Functional Limits for tasks assessed                                     General Comments       Exercises     Shoulder Instructions      Home Living Family/patient expects to be discharged to:: Private residence Living Arrangements: Alone Available Help at Discharge:  Family(son) Type of Home: House Home Access: Stairs to enter [employer] of Steps: 1/2 step entrance at the carport. Entrance Stairs-Rails: None Home Layout: One level     Bathroom Shower/Tub: Walk-in shower;Door         Home Equipment: None          Prior Functioning/Environment Level of Independence: Independent                 OT Problem List: Decreased strength;Decreased knowledge of use of DME or AE;Impaired UE functional use;Decreased coordination;Decreased range of motion;Decreased activity tolerance      OT Treatment/Interventions: Self-care/ADL training;Therapeutic exercise;Patient/family education;Therapeutic activities;DME and/or AE instruction    OT Goals(Current goals can be found in the care plan section)  Acute Rehab OT Goals Patient Stated Goal: To return home Potential to Achieve Goals: Good  OT Frequency: Min 2X/week   Barriers to D/C:            Co-evaluation              AM-PAC OT "6 Clicks" Daily Activity     Outcome Measure Help from another person eating meals?: None Help from another person taking care of personal grooming?: None Help from another person toileting, which includes using toliet, bedpan, or urinal?: A Lot Help from another person bathing (including washing, rinsing, drying)?: A Lot Help from another person to put on and taking off regular upper body clothing?: A Little Help from another person to put on and taking off regular lower body clothing?: A Lot 6 Click Score: 17   End of Session    Activity Tolerance: Patient tolerated treatment well Patient left: in bed;with call bell/phone within reach;with bed alarm set;with family/visitor present  OT Visit Diagnosis: Unsteadiness on feet (R26.81);Muscle weakness (generalized) (M62.81)                Time: 1035-1100 OT Time Calculation (min): 25 min Charges:  OT General Charges $OT Visit: 1 Visit OT Evaluation $OT Eval Moderate Complexity: 1 Mod  Olegario Messier, MS, OTR/L  Olegario Messier 07/19/2018, 1:27 PM

## 2018-07-19 NOTE — Care Management Obs Status (Signed)
MEDICARE OBSERVATION STATUS NOTIFICATION   Patient Details  Name: Lorraine Wyatt MRN: [id number] Date of Birth: [birth year]   Medicare Observation Status Notification Given:  Yes; explained to patient    Gwenette Greet, RN 07/19/2018, 8:55 AM

## 2018-07-19 NOTE — Plan of Care (Signed)
  Problem: Education: Goal: Knowledge of disease or condition will improve Outcome: Progressing   Problem: Education: Goal: Knowledge of secondary prevention will improve Outcome: Progressing   Problem: Education: Goal: Knowledge of patient specific risk factors addressed and post discharge goals established will improve Outcome: Progressing   Problem: Self-Care: Goal: Ability to participate in self-care as condition permits will improve Outcome: Progressing   Problem: Self-Care: Goal: Verbalization of feelings and concerns over difficulty with self-care will improve Outcome: Progressing   Problem: Self-Care: Goal: Ability to communicate needs accurately will improve Outcome: Progressing

## 2018-07-19 NOTE — Progress Notes (Signed)
[facility] [place] [facility]      PATIENT NAME: Lorraine Wyatt    MR#:  [id number]  DATE OF BIRTH:  10-17-1945  SUBJECTIVE:   Patient presents to the hospital due to worsening weakness and difficulty walking.  Patient has underlying Parkinson's but is usually able to get around.  Denies any focal weakness or numbness presently.  Patient CT head, MRI of the brain are negative for acute stroke.  REVIEW OF SYSTEMS:    Review of Systems  Constitutional: Negative for chills and fever.  HENT: Negative for congestion and tinnitus.   Eyes: Negative for blurred vision and double vision.  Respiratory: Negative for cough, shortness of breath and wheezing.   Cardiovascular: Negative for chest pain, orthopnea and PND.  Gastrointestinal: Negative for abdominal pain, diarrhea, nausea and vomiting.  Genitourinary: Negative for dysuria and hematuria.  Neurological: Positive for weakness (generalized). Negative for dizziness, sensory change and focal weakness.  All other systems reviewed and are negative.   Nutrition: heart Healthy Tolerating Diet: Yes Tolerating PT: Await Eval.   DRUG ALLERGIES:   Allergies  Allergen Reactions  . Clarithromycin   . Erythromycin Ethylsuccinate   . Trandolapril     VITALS:  Blood pressure 135/70, pulse 92, temperature 98.5 F (36.9 C), temperature source Oral, resp. rate 18, height 5\' 3"  (1.6 m), weight 63.5 kg, SpO2 97 %.  PHYSICAL EXAMINATION:   Physical Exam  GENERAL:  73 y.o.-year-old patient lying in bed in no acute distress.  EYES: Pupils equal, round, reactive to light and accommodation. No scleral icterus. Extraocular muscles intact.  HEENT: Head atraumatic, normocephalic. Oropharynx and nasopharynx clear.  NECK:  Supple, no jugular venous distention. No thyroid enlargement, no tenderness.  LUNGS: Good A/e B/l, no wheezing, rales, rhonchi. No use of accessory muscles of respiration.  CARDIOVASCULAR: S1, S2 normal. No  murmurs, rubs, or gallops.  ABDOMEN: Soft, nontender, nondistended. Bowel sounds present. No organomegaly or mass.  EXTREMITIES: No cyanosis, clubbing or edema b/l.    NEUROLOGIC: Cranial nerves II through XII are intact. No focal Motor or sensory deficits b/l.  Globally weak. Left:  Upper extremity positive for bradyphrenia, bradykinesia, mild cogwheeling, rigidity. Resting tremors.  Left: Increased tone. Able to break gravity PSYCHIATRIC: The patient is alert and oriented x 3.  SKIN: No obvious rash, lesion, or ulcer.    LABORATORY PANEL:   CBC Recent Labs  Lab 07/18/18 1350  WBC 4.3  HGB 11.6*  HCT 36.2  PLT 206   ------------------------------------------------------------------------------------------------------------------  Chemistries  Recent Labs  Lab 07/18/18 1350  NA 134*  K 3.5  CL 99  CO2 24  GLUCOSE 140*  BUN 25*  CREATININE 1.20*  CALCIUM 8.8*   ------------------------------------------------------------------------------------------------------------------  Cardiac Enzymes No results for input(s): TROPONINI in the last 168 hours. ------------------------------------------------------------------------------------------------------------------  RADIOLOGY:  Ct Head Wo Contrast  Result Date: 07/18/2018 CLINICAL DATA:  Focal neural deficit of greater than 6 hours, history of Parkinson's disease EXAM: CT HEAD WITHOUT CONTRAST TECHNIQUE: Contiguous axial images were obtained from the base of the skull through the vertex without intravenous contrast. COMPARISON:  None. FINDINGS: Brain: The ventricular system is normal in size and configuration, and there is only mild cortical atrophy present. The septum is midline in position. The fourth ventricle and basilar cisterns are unremarkable. No hemorrhage, mass lesion, or acute infarction is seen. There may be very mild small vessel ischemic change throughout the periventricular white matter. Vascular: No vascular  abnormality is seen on this unenhanced study.  Skull: On bone window images, no calvarial abnormality is seen. Sinuses/Orbits: The paranasal sinuses appear well pneumatized. Other: None. IMPRESSION: Minimal cortical atrophy and perhaps mild small vessel ischemic change throughout the periventricular white matter. No acute intracranial abnormality. Electronically Signed   By: Dwyane Dee M.D.   On: 07/18/2018 14:37   [name] EB Contrast  Result Date: 07/18/2018 CLINICAL DATA:  Initial evaluation for acute left lower extremity weakness. EXAM: MRI HEAD WITHOUT CONTRAST MRA HEAD WITHOUT CONTRAST TECHNIQUE: Multiplanar, multiecho pulse sequences of the brain and surrounding structures were obtained without intravenous contrast. Angiographic images of the head were obtained using MRA technique without contrast. COMPARISON:  Prior CT from earlier the same day. FINDINGS: MRI HEAD FINDINGS Brain: Generalized age-related cerebral atrophy. 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 Findings felt to be within normal limits for age. No abnormal foci of restricted diffusion to suggest acute or subacute ischemia. Gray-white matter differentiation maintained. No evidence for chronic infarction. No acute or chronic intracranial hemorrhage. No mass lesion, midline shift or mass effect. No hydrocephalus. No extra fluid collection. Pituitary gland normal. Vascular: Major intracranial vascular flow voids maintained. Skull and upper cervical spine: Craniocervical junction normal. Upper cervical spine normal. Bone marrow signal intensity within normal limits. No scalp soft tissue abnormality. Sinuses/Orbits: Patient status post bilateral ocular lens replacement. Paranasal sinuses are clear. No mastoid effusion. Inner ear structures normal. Other: None. MRA HEAD FINDINGS ANTERIOR  CIRCULATION: Distal cervical segments of the internal carotid arteries are widely patent with antegrade flow. Petrous, cavernous, and supraclinoid segments widely patent without stenosis. A1 segments, anterior communicating artery common anterior cerebral arteries widely patent bilaterally. No M1 stenosis or occlusion. Distal MCA branches well perfused and symmetric. POSTERIOR CIRCULATION: Vertebral arteries widely patent to the vertebrobasilar junction without stenosis. Posterior inferior cerebral arteries patent bilaterally. Basilar widely patent to its distal aspect without stenosis. Superior cerebral arteries patent bilaterally. Right PCA primarily supplied via the basilar. Hypoplastic left P1 with superimposed mild stenosis. Robust and widely patent left posterior communicating artery. PCAs widely patent to their distal aspects without stenosis. No intracranial aneurysm. IMPRESSION: MRI HEAD IMPRESSION: Negative brain MRI. No acute intracranial infarct or other abnormality identified. MRA HEAD IMPRESSION: Negative intracranial MRA. No large vessel occlusion. No hemodynamically significant or correctable stenosis. Electronically Signed   By: Rise Mu M.D.   On: 07/18/2018 18:25   Mr Brain Wo Contrast  Result Date: 07/18/2018 CLINICAL DATA:  Initial evaluation for acute left lower extremity weakness. EXAM: MRI HEAD WITHOUT CONTRAST MRA HEAD WITHOUT CONTRAST TECHNIQUE: Multiplanar, multiecho pulse sequences of the brain and surrounding structures were obtained without intravenous contrast. Angiographic images of the head were obtained using MRA technique without contrast. COMPARISON:  Prior CT from earlier the same day. FINDINGS: MRI HEAD FINDINGS Brain: Generalized age-related cerebral atrophy. 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 Findings  felt to be within normal limits for age. No abnormal foci of restricted diffusion to suggest acute or subacute ischemia. Gray-white matter differentiation maintained. No evidence for chronic infarction. No acute or chronic intracranial hemorrhage. No mass lesion, midline shift or mass effect. No hydrocephalus. No extra fluid collection. Pituitary gland normal. Vascular: Major intracranial vascular flow voids maintained. Skull and upper cervical spine: Craniocervical junction normal. Upper cervical spine normal. Bone marrow signal intensity within normal  limits. No scalp soft tissue abnormality. Sinuses/Orbits: Patient status post bilateral ocular lens replacement. Paranasal sinuses are clear. No mastoid effusion. Inner ear structures normal. Other: None. MRA HEAD FINDINGS ANTERIOR CIRCULATION: Distal cervical segments of the internal carotid arteries are widely patent with antegrade flow. Petrous, cavernous, and supraclinoid segments widely patent without stenosis. A1 segments, anterior communicating artery common anterior cerebral arteries widely patent bilaterally. No M1 stenosis or occlusion. Distal MCA branches well perfused and symmetric. POSTERIOR CIRCULATION: Vertebral arteries widely patent to the vertebrobasilar junction without stenosis. Posterior inferior cerebral arteries patent bilaterally. Basilar widely patent to its distal aspect without stenosis. Superior cerebral arteries patent bilaterally. Right PCA primarily supplied via the basilar. Hypoplastic left P1 with superimposed mild stenosis. Robust and widely patent left posterior communicating artery. PCAs widely patent to their distal aspects without stenosis. No intracranial aneurysm. IMPRESSION: MRI HEAD IMPRESSION: Negative brain MRI. No acute intracranial infarct or other abnormality identified. MRA HEAD IMPRESSION: Negative intracranial MRA. No large vessel occlusion. No hemodynamically significant or correctable stenosis. Electronically Signed    By: Rise MuBenjamin  McClintock M.D.   On: 07/18/2018 18:25   [place] Carotid Bilateral (at [facility] And [facility] Only)  Result Date: 07/19/2018 CLINICAL DATA:  TIA. History of hypertension, hyperlipidemia and diabetes. EXAM: BILATERAL CAROTID DUPLEX ULTRASOUND TECHNIQUE: Wallace CullensGray scale imaging, color Doppler and duplex ultrasound were performed of bilateral carotid and vertebral arteries in the neck. COMPARISON:  None. FINDINGS: Criteria: Quantification of carotid stenosis is based on velocity parameters that correlate the residual internal carotid diameter with NASCET-based stenosis levels, using the diameter of the distal internal carotid lumen as the denominator for stenosis measurement. The following velocity measurements were obtained: RIGHT ICA: 78/19 cm/sec CCA: 94/23 cm/sec SYSTOLIC ICA/CCA RATIO:  0.8 ECA: 86 cm/sec LEFT ICA: 93/26 cm/sec CCA: 73/15 cm/sec SYSTOLIC ICA/CCA RATIO:  1.3 ECA: 98 cm/sec RIGHT CAROTID ARTERY: There is a minimal amount of echogenic plaque within the right carotid bulb (image 15), not resulting in elevated peak systolic velocities within the interrogated course of the right internal carotid artery to suggest a hemodynamically significant stenosis. RIGHT VERTEBRAL ARTERY:  Antegrade flow LEFT CAROTID ARTERY: There is a minimal amount of eccentric hypoechoic plaque within the left carotid bulb (image 46), not resulting in elevated peak systolic velocities within the interrogated course of the left internal carotid artery to suggest a hemodynamically significant stenosis. LEFT VERTEBRAL ARTERY:  Antegrade Flow IMPRESSION: Very minimal amount of bilateral atherosclerotic plaque, right subjectively greater than left, not resulting in a hemodynamically significant stenosis within either internal carotid artery. Electronically Signed   By: Simonne ComeJohn  Watts M.D.   On: 07/19/2018 09:00     ASSESSMENT AND PLAN:   73 year old female with past medical history of hypertension, hyperlipidemia, Parkinson's  disease, diabetes who presented to the hospital due to lower extremity weakness/difficulty walking.  1.  Difficulty walking/lower extremity weakness- this has progressed over the past week to 10 days. -Patient admitted to the hospital for work-up for possible stroke which has been negative.  Patient CT head and MRI brain are negative for acute stroke.  Carotid duplex showing no hemodynamically significant carotid stenosis. - Appreciate neurology consult and they think this is likely secondary to patient's progressive Parkinson's.  Patient was on selegiline but could not tolerated.  Currently being treated with Requip 4 times a day. - As per neurology will start on some Sinemet, lower Requip, follow clinically. -Await physical therapy evaluation.  2. DM - cont. Glipizide,  SSI. Follow BS  3. Essential HTN -  cont. Coreg, Losartan  4. Hyperlipidemia - cont. Pravachol.       All the records are reviewed and case discussed with Care Management/Social Worker. Management plans discussed with the patient, family and they are in agreement.  CODE STATUS: DNR  DVT Prophylaxis: Lovenox  TOTAL TIME TAKING CARE OF THIS PATIENT: 30 minutes.   POSSIBLE D/C IN 1-2 DAYS, DEPENDING ON CLINICAL CONDITION.   Houston SirenSAINANI,VIVEK J M.D on 07/19/2018 at 1:42 PM  Between 7am to 6pm - Pager - [phone]  After 6pm go to [email or web address] - Social research officer, governmentpassword [email or web address] [email or web address]  [facility] [place] [facility]  Office  [phone]  CC: Primary care physician; Jerl MinaHedrick, James, MD

## 2018-07-19 NOTE — Progress Notes (Signed)
SLP Cancellation Note  Patient Details Name: Lorraine Wyatt MRN: [id number] DOB: 28-Nov-1945   Cancelled treatment:       Reason Eval/Treat Not Completed: SLP screened, no needs identified, will sign off(chart reviewed; consulted NSG then met w/ pt in room). Pt denied any difficulty swallowing and is currently on a regular diet; tolerates swallowing pills w/ water per NSG. Pt conversed at conversational level w/out speech-language deficits noted; pt and family denied any speech-language deficits. Pt does have a baseline dx of Parkinson's Disease and tries to "remember" to use her "Loud" strategies to increase her volume of speech that she learned during Outpatient therapy(2017). No further skilled ST services indicated as pt appears at her baseline. Pt/family agreed. NSG to reconsult if any change in status.     Orinda Kenner, [place], CCC-SLP Watson,Katherine 07/19/2018, 11:55 AM

## 2018-07-19 NOTE — Evaluation (Signed)
Physical Therapy Evaluation Patient Details Name: Lorraine Wyatt MRN: [id number] DOB: 1945-11-03 Today's Date: 07/19/2018   History of Present Illness  presented to ER secondary to L UE weakness; admitted initially for TIA/CVA work up.  Imaging negative for acute infarct.  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  Does endorse headache and soreness in back; RN informed/aware and administered pain medication during session.  Patient noted with mild/mod tremor to L UE > LE, worsened from baseline per patient report.  Additionally, mild increase in tone, mild/mod bradykinesia noted throughout L hemi-body.  Generally flat affect.  Persistent posterior weight shift with all sitting postures.  Currently requiring mod/max assist for supine/sit, min assist for unsupported sitting balance. Unable to tolerate sit/stand or OOB transfer with transition to upright.  Generally hypotensive with unsupported sitting; suspect some degree of orthostasis (related to start of carbidopa/levodopa)?  Will continue to monitor and progress mobility in subsequent sessions as appropriate. Would benefit from skilled PT to address above deficits and promote optimal return to PLOF; recommend transition to STR upon discharge from acute hospitalization.     Follow Up Recommendations SNF    Equipment Recommendations       Recommendations for Other Services       Precautions / Restrictions Precautions Precautions: None Restrictions Weight Bearing Restrictions: No      Mobility  Bed Mobility Overal bed mobility: Needs Assistance Bed Mobility: Supine to Sit;Sit to Supine     Supine to sit: Min assist;Mod assist Sit to supine: Max assist      Transfers                 General transfer comment: deferred due to  dizziness with upright positioning  Ambulation/Gait             General Gait Details: deferred due to dizziness, nausea with upright positioning  Stairs            Wheelchair Mobility    Modified Rankin (Stroke Patients Only)       Balance Overall balance assessment: Needs assistance Sitting-balance support: No upper extremity supported;Feet supported Sitting balance-Leahy Scale: Fair                                       Pertinent Vitals/Pain Pain Assessment: (headache, soreness in back; meds requested/administered per RN)    Home Living Family/patient expects to be discharged to:: Private residence Living Arrangements: Alone Available Help at Discharge: Family;Available PRN/intermittently Type of Home: House Home Access: Stairs to enter Entrance Stairs-Rails: None Entrance Stairs-Number of Steps: 1/2 step entrance at the carport. Home Layout: One level Home Equipment: None      Prior Function Level of Independence: Independent         Comments: Indep with ADLs, household and community mobilization without assist device     Hand Dominance   Dominant Hand: Right    Extremity/Trunk Assessment   Upper Extremity Assessment Upper Extremity Assessment: (L UE grossly 4-/5, mild/mod tremors (at rest and with intention), increased tone/rigidity; R UE grossly WFL)    Lower Extremity Assessment Lower Extremity Assessment: (L LE grossly 3-/5, ankle DF to neutral only (rests in position of PF ith increased tone); R LE grossly 4-/5)    Cervical / Trunk  Assessment Cervical / Trunk Assessment: Kyphotic(maintains post pelvic tilt, limited lumbar extesion, very forward head posture with excessiv eposterior weight shift)  Communication   Communication: No difficulties(limited oral movement, somewhat hypophonic)  Cognition Arousal/Alertness: Awake/alert Behavior During Therapy: WFL for tasks assessed/performed;Flat affect Overall Cognitive  Status: Within Functional Limits for tasks assessed                                        General Comments      Exercises Other Exercises Other Exercises: Unsupported sitting, worked on postural extension and anterior weight translation, min assist. Limited tolerance due to fatigue, nausea with transition to upright   Assessment/Plan    PT Assessment Patient needs continued PT services  PT Problem List Decreased strength;Decreased activity tolerance;Decreased mobility;Decreased safety awareness;Impaired tone;Decreased range of motion;Decreased balance;Decreased coordination;Decreased knowledge of use of DME;Decreased knowledge of precautions       PT Treatment Interventions DME instruction;Therapeutic activities;Stair training;Balance training;Gait training;Functional mobility training;Therapeutic exercise;Neuromuscular re-education    PT Goals (Current goals can be found in the Care Plan section)  Acute Rehab PT Goals Patient Stated Goal: to return home PT Goal Formulation: With patient Time For Goal Achievement: 08/02/18 Potential to Achieve Goals: Good    Frequency Min 2X/week   Barriers to discharge Decreased caregiver support      Co-evaluation               AM-PAC PT "6 Clicks" Mobility  Outcome Measure Help needed turning from your back to your side while in a flat bed without using bedrails?: A Lot Help needed moving from lying on your back to sitting on the side of a flat bed without using bedrails?: A Lot Help needed moving to and from a bed to a chair (including a wheelchair)?: Total Help needed standing up from a chair using your arms (e.g., wheelchair or bedside chair)?: Total Help needed to walk in hospital room?: Total Help needed climbing 3-5 steps with a railing? : Total 6 Click Score: 8    End of Session   Activity Tolerance: Treatment limited secondary to medical complications (Comment)(nausea; orthostatic hypotension) Patient  left: in bed;with call bell/phone within reach;with bed alarm set Nurse Communication: Mobility status PT Visit Diagnosis: Unsteadiness on feet (R26.81);Muscle weakness (generalized) (M62.81);Difficulty in walking, not elsewhere classified (R26.2)    Time: [id number] PT Time Calculation (min) (ACUTE ONLY): 23 min   Charges:   PT Evaluation $PT Eval Moderate Complexity: 1 Mod PT Treatments $Therapeutic Activity: 8-22 mins       Latrecia Capito H. Manson PasseyBrown, PT, DPT, NCS 07/19/18, 11:41 PM [phone]

## 2018-07-19 NOTE — Care Management Note (Signed)
Case Management Note  Patient Details  Name: Lorraine PennerJulia E Arechiga MRN: [id number] Date of Birth: [birth year]  Subjective/Objective:                  Admitted to [facility] under observation status with the diagnosis of TIA. Lives alone. Son is Iantha FallenKenneth [phone]). [facility] [address]. No skilled nursing. No home health. States she has never been to outpatient physical therapy. States she has no medical equipment in the home.  Takes care of all basic activities of daily living herself, drove until Friday. No falls. Good appetite until about a month ago.  Action/Plan: Stroke work-up in progress.    Expected Discharge Date:                  Expected Discharge Plan:     In-House Referral:   yes  Discharge planning Services   yes  Post Acute Care Choice:    Choice offered to:     DME Arranged:    DME Agency:     HH Arranged:    HH Agency:     Status of Service:     If discussed at [employer] Length of Stay Meetings, dates discussed:    Additional Comments:  Gwenette GreetBrenda S Mohab Ashby, RN MSN CCM Care Management [phone] 07/19/2018, 9:17 AM

## 2018-07-19 NOTE — Consult Note (Addendum)
Reason for Consult: Parkinson's disease Referring Physician: Houston SirenSainani, Vivek J. MD  CC: Left side weakness  HPI: Lorraine Wyatt is an 73 y.o. female with past medical history of diabetes mellitus, hyperlipidemia, hypertension, anemia, GERD, and Parkinson's disease presenting to the ED on 07/18/2018 with chief complaints of worsening weakness on the left and stiffness. Patient reports that she was recently seen by her neurologist 06/07/2018 for worsening Parkinson's symptoms. She reported that she was having difficulty walking and seems to be dragging her left foot more, she is getting slower with worsening tremors and stiffness on the left.  Her neurologist started her on a new medication terra Zosyn 1 mg daily for 1 week, then increase by 1 mg daily each week until patient was taking 5 mg daily.  Patient reports that after taking the medication for 4 weeks she started having headaches, feeling weak and nauseated.  She therefore called her neurologist who advised us to stop taking the medication and if her symptoms did not improve to go to the ER for further evaluation.  She has since been off the medication for 10 days and reports that she is feeling very weak and unable to ambulate.  [name] son at the bedside report worsening stiffness, tremors worse on the left and increased bradykinesia.  On arrival to the ED she had a work-up including CT head which was negative for acute intracranial abnormality to explain her worsening symptoms.  Follow-up MRI brain and MRA was also negative.  She was admitted for further evaluation and management.  Past Medical History:  Diagnosis Date  . Diabetes mellitus without complication (HCC)   . Hyperlipidemia   . Hypertension   . Parkinson disease [facility](HCC)     Past Surgical History:  Procedure Laterality Date  . COLONOSCOPY WITH PROPOFOL N/A 12/12/2017   Procedure: COLONOSCOPY WITH PROPOFOL;  Surgeon: Scot JunElliott, Robert T, MD;  [facility];  Service:  Endoscopy;  Laterality: N/A;  . ESOPHAGOGASTRODUODENOSCOPY (EGD) WITH PROPOFOL N/A 12/12/2017   Procedure: ESOPHAGOGASTRODUODENOSCOPY (EGD) WITH PROPOFOL;  Surgeon: Scot JunElliott, Robert T, MD;  [facility];  Service: Endoscopy;  Laterality: N/A;    History reviewed. No pertinent family history.  Social History:  reports that she has never smoked. She has never used smokeless tobacco. She reports previous alcohol use. She reports that she does not use drugs.  Allergies  Allergen Reactions  . Clarithromycin   . Erythromycin Ethylsuccinate   . Trandolapril     Medications:  I have reviewed the patient's current medications. Prior to Admission:  Medications Prior to Admission  Medication Sig Dispense Refill Last Dose  . carvedilol (COREG) 3.125 MG tablet Take 3.125 mg by mouth 2 (two) times daily with a meal.   07/18/2018 at 0700  . glipiZIDE (GLUCOTROL) 5 MG tablet Take 5 mg by mouth daily before breakfast.    07/18/2018 at 0700  . losartan (COZAAR) 50 MG tablet Take 50 mg by mouth daily.   07/18/2018 at 0700  . metFORMIN (GLUCOPHAGE) 1000 MG tablet Take 1,000 mg by mouth 2 (two) times daily with a meal.   07/18/2018 at 0700  . omeprazole (PRILOSEC) 20 MG capsule Take 20 mg by mouth daily.   07/18/2018 at 0700  . pravastatin (PRAVACHOL) 40 MG tablet Take 40 mg by mouth daily.   07/17/2018 at 2100  . rOPINIRole (REQUIP) 0.25 MG tablet Take 0.25 mg by mouth 3 (three) times daily.   07/18/2018 at 0700  . selegiline (ELDEPRYL) 5 MG capsule Take 5  mg by mouth daily.    07/18/2018 at 0700  . sitaGLIPtin (JANUVIA) 100 MG tablet Take 100 mg by mouth daily.   07/18/2018 at 0700  . triamterene-hydrochlorothiazide (MAXZIDE-25) 37.5-25 MG tablet Take 1 tablet by mouth daily.   07/18/2018 at 07000   Scheduled: . aspirin  300 mg Rectal Daily   Or  . aspirin  325 mg Oral Daily  . carbidopa-levodopa  1 tablet Oral TID  . carvedilol  3.125 mg Oral BID WC  . enoxaparin (LOVENOX) injection  40 mg  Subcutaneous Q24H  . glipiZIDE  5 mg Oral QAC breakfast  . insulin aspart  0-5 Units Subcutaneous QHS  . insulin aspart  0-9 Units Subcutaneous TID WC  . losartan  50 mg Oral Daily  . pantoprazole  40 mg Oral Daily  . pravastatin  40 mg Oral Daily  . rOPINIRole  0.25 mg Oral TID    ROS: History obtained from the patient   General ROS: negative for - chills, fatigue, fever, night sweats, weight gain or weight loss Psychological ROS: negative for - behavioral disorder, hallucinations, memory difficulties, mood swings or suicidal ideation Ophthalmic ROS: negative for - blurry vision, double vision, eye pain or loss of vision ENT ROS: negative for - epistaxis, nasal discharge, oral lesions, sore throat, tinnitus or vertigo Allergy and Immunology ROS: negative for - hives or itchy/watery eyes Hematological and Lymphatic ROS: negative for - bleeding problems, bruising or swollen lymph nodes Endocrine ROS: negative for - galactorrhea, hair pattern changes, polydipsia/polyuria or temperature intolerance Respiratory ROS: negative for - cough, hemoptysis, shortness of breath or wheezing Cardiovascular ROS: negative for - chest pain, dyspnea on exertion, edema or irregular heartbeat Gastrointestinal ROS: negative for - abdominal pain, diarrhea, hematemesis, nausea/vomiting or stool incontinence Genito-Urinary ROS: negative for - dysuria, hematuria. Positive for :incontinence or urinary frequency/urgency Musculoskeletal ROS: Positive for weakness, stiffness Neurological ROS: as noted in HPI Dermatological ROS: negative for rash and skin lesion changes  Physical Examination: Blood pressure 135/70, pulse 92, temperature 98.5 F (36.9 C), temperature source Oral, resp. rate 18, height 5\' 3"  (1.6 m), weight 63.5 kg, SpO2 97 %.  HEENT-  Normocephalic, no lesions, without obvious abnormality.  Normal external eye and conjunctiva.  Normal TM's bilaterally.  Normal auditory canals and external ears.  Normal external nose, mucus membranes and septum.  Normal pharynx. Cardiovascular- S1, S2 normal, pulses palpable throughout   Lungs- chest clear, no wheezing, rales, normal symmetric air entry Abdomen- soft, non-tender; bowel sounds normal; no masses,  no organomegaly Extremities- no edema Lymph-no adenopathy palpable Musculoskeletal-no joint tenderness, deformity or swelling Skin-warm and dry, no hyperpigmentation, vitiligo, or suspicious lesions  Neurological Exam   Mental Status: Alert, oriented, thought content appropriate.  Speech hypophonic without evidence of aphasia.  Able to follow 3 step commands without difficulty. Attention span and concentration seemed appropriate  Cranial Nerves: II: Discs flat bilaterally; Visual fields grossly normal, pupils equal, round, reactive to light and accommodation III,IV, VI: ptosis not present, extra-ocular motions intact bilaterally V,VII: smile symmetric, facial light touch sensation intact. Hypomimia VIII: hearing normal bilaterally IX,X: gag reflex present XI: bilateral shoulder shrug XII: midline tongue extension Motor:  Right :  Upper extremity   5/5 without pronator drift  Left:  Upper extremity positive for bradyphrenia, bradykinesia, mild cogwheeling, rigidity. Resting tremors Right:   Lower extremity   5/5  Left: Increased tone. Able to break gravity  Sensory: Pinprick and light touch intact bilaterally Deep Tendon Reflexes: 2+ and symmetric throughout Plantars: Right: mute                              Left: mute Cerebellar: Finger-to-nose testing intact on the right. Unable to perform Heel to shin testing due to stiffness Gait: not tested due to safety concerns  Data Reviewed  Laboratory Studies:   Basic Metabolic Panel: Recent Labs  Lab 07/18/18 1350  NA 134*  K 3.5  CL 99  CO2 24  GLUCOSE 140*  BUN 25*  CREATININE 1.20*  CALCIUM 8.8*    Liver Function Tests: No results for  input(s): AST, ALT, ALKPHOS, BILITOT, PROT, ALBUMIN in the last 168 hours. No results for input(s): LIPASE, AMYLASE in the last 168 hours. No results for input(s): AMMONIA in the last 168 hours.  CBC: Recent Labs  Lab 07/18/18 1350  WBC 4.3  HGB 11.6*  HCT 36.2  MCV 86.2  PLT 206    Cardiac Enzymes: No results for input(s): CKTOTAL, CKMB, CKMBINDEX, TROPONINI in the last 168 hours.  BNP: Invalid input(s): POCBNP  CBG: Recent Labs  Lab 07/18/18 1707 07/18/18 2104 07/19/18 0756 07/19/18 1152  GLUCAP 103* 219* 106* 136*    Microbiology: No results found for this or any previous visit.  Coagulation Studies: No results for input(s): LABPROT, INR in the last 72 hours.  Urinalysis:  Recent Labs  Lab 07/18/18 1533  COLORURINE YELLOW*  LABSPEC 1.012  PHURINE 6.0  GLUCOSEU NEGATIVE  HGBUR NEGATIVE  BILIRUBINUR NEGATIVE  KETONESUR NEGATIVE  PROTEINUR NEGATIVE  NITRITE NEGATIVE  LEUKOCYTESUR NEGATIVE    Lipid Panel:     Component Value Date/Time   CHOL 140 07/19/2018 0342   TRIG 138 07/19/2018 0342   HDL 44 07/19/2018 0342   CHOLHDL 3.2 07/19/2018 0342   VLDL 28 07/19/2018 0342   LDLCALC 68 07/19/2018 0342    HgbA1C: No results found for: HGBA1C  Urine Drug Screen:  No results found for: LABOPIA, COCAINSCRNUR, LABBENZ, AMPHETMU, THCU, LABBARB  Alcohol Level: No results for input(s): ETH in the last 168 hours.  Other results: EKG: normal EKG, normal sinus rhythm, unchanged from previous tracings.  Imaging: Ct Head Wo Contrast  Result Date: 07/18/2018 CLINICAL DATA:  Focal neural deficit of greater than 6 hours, history of Parkinson's disease EXAM: CT HEAD WITHOUT CONTRAST TECHNIQUE: Contiguous axial images were obtained from the base of the skull through the vertex without intravenous contrast. COMPARISON:  None. FINDINGS: Brain: The ventricular system is normal in size and configuration, and there is only mild cortical atrophy present. The septum is  midline in position. The fourth ventricle and basilar cisterns are unremarkable. No hemorrhage, mass lesion, or acute infarction is seen. There may be very mild small vessel ischemic change throughout the periventricular white matter. Vascular: No vascular abnormality is seen on this unenhanced study. Skull: On bone window images, no calvarial abnormality is seen. Sinuses/Orbits: The paranasal sinuses appear well pneumatized. Other: None. IMPRESSION: Minimal cortical atrophy and perhaps mild small vessel ischemic change throughout the periventricular white matter. No acute intracranial abnormality. Electronically Signed   By: Dwyane Dee M.D.   On: 07/18/2018 14:37   [name] RC Contrast  Result Date: 07/18/2018 CLINICAL DATA:  Initial evaluation for acute left lower extremity weakness. EXAM: MRI HEAD WITHOUT CONTRAST MRA HEAD WITHOUT CONTRAST TECHNIQUE: Multiplanar, multiecho pulse  sequences of the brain and surrounding structures were obtained without intravenous contrast. Angiographic images of the head were obtained using MRA technique without contrast. COMPARISON:  Prior CT from earlier the same day. FINDINGS: MRI HEAD FINDINGS Brain: Generalized age-related cerebral atrophy. 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 Findings felt to be within normal limits for age. No abnormal foci of restricted diffusion to suggest acute or subacute ischemia. Gray-white matter differentiation maintained. No evidence for chronic infarction. No acute or chronic intracranial hemorrhage. No mass lesion, midline shift or mass effect. No hydrocephalus. No extra fluid collection. Pituitary gland normal. Vascular: Major intracranial vascular flow voids maintained. Skull and upper cervical spine: Craniocervical junction normal. Upper cervical spine normal. Bone marrow signal  intensity within normal limits. No scalp soft tissue abnormality. Sinuses/Orbits: Patient status post bilateral ocular lens replacement. Paranasal sinuses are clear. No mastoid effusion. Inner ear structures normal. Other: None. MRA HEAD FINDINGS ANTERIOR CIRCULATION: Distal cervical segments of the internal carotid arteries are widely patent with antegrade flow. Petrous, cavernous, and supraclinoid segments widely patent without stenosis. A1 segments, anterior communicating artery common anterior cerebral arteries widely patent bilaterally. No M1 stenosis or occlusion. Distal MCA branches well perfused and symmetric. POSTERIOR CIRCULATION: Vertebral arteries widely patent to the vertebrobasilar junction without stenosis. Posterior inferior cerebral arteries patent bilaterally. Basilar widely patent to its distal aspect without stenosis. Superior cerebral arteries patent bilaterally. Right PCA primarily supplied via the basilar. Hypoplastic left P1 with superimposed mild stenosis. Robust and widely patent left posterior communicating artery. PCAs widely patent to their distal aspects without stenosis. No intracranial aneurysm. IMPRESSION: MRI HEAD IMPRESSION: Negative brain MRI. No acute intracranial infarct or other abnormality identified. MRA HEAD IMPRESSION: Negative intracranial MRA. No large vessel occlusion. No hemodynamically significant or correctable stenosis. Electronically Signed   By: Rise Mu M.D.   On: 07/18/2018 18:25   Mr Brain Wo Contrast  Result Date: 07/18/2018 CLINICAL DATA:  Initial evaluation for acute left lower extremity weakness. EXAM: MRI HEAD WITHOUT CONTRAST MRA HEAD WITHOUT CONTRAST TECHNIQUE: Multiplanar, multiecho pulse sequences of the brain and surrounding structures were obtained without intravenous contrast. Angiographic images of the head were obtained using MRA technique without contrast. COMPARISON:  Prior CT from earlier the same day. FINDINGS: MRI HEAD FINDINGS  Brain: Generalized age-related cerebral atrophy. 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 Findings felt to be within normal limits for age. No abnormal foci of restricted diffusion to suggest acute or subacute ischemia. Gray-white matter differentiation maintained. No evidence for chronic infarction. No acute or chronic intracranial hemorrhage. No mass lesion, midline shift or mass effect. No hydrocephalus. No extra fluid collection. Pituitary gland normal. Vascular: Major intracranial vascular flow voids maintained. Skull and upper cervical spine: Craniocervical junction normal. Upper cervical spine normal. Bone marrow signal intensity within normal limits. No scalp soft tissue abnormality. Sinuses/Orbits: Patient status post bilateral ocular lens replacement. Paranasal sinuses are clear. No mastoid effusion. Inner ear structures normal. Other: None. MRA HEAD FINDINGS ANTERIOR CIRCULATION: Distal cervical segments of the internal carotid arteries are widely patent with antegrade flow. Petrous, cavernous, and supraclinoid segments widely patent without stenosis. A1 segments, anterior communicating artery common anterior cerebral arteries widely patent bilaterally. No M1 stenosis or occlusion. Distal MCA branches well perfused and symmetric. POSTERIOR CIRCULATION: Vertebral arteries widely patent to the vertebrobasilar  junction without stenosis. Posterior inferior cerebral arteries patent bilaterally. Basilar widely patent to its distal aspect without stenosis. Superior cerebral arteries patent bilaterally. Right PCA primarily supplied via the basilar. Hypoplastic left P1 with superimposed mild stenosis. Robust and widely patent left posterior communicating artery. PCAs widely patent to their distal aspects without stenosis. No intracranial aneurysm. IMPRESSION: MRI  HEAD IMPRESSION: Negative brain MRI. No acute intracranial infarct or other abnormality identified. MRA HEAD IMPRESSION: Negative intracranial MRA. No large vessel occlusion. No hemodynamically significant or correctable stenosis. Electronically Signed   By: Rise MuBenjamin  McClintock M.D.   On: 07/18/2018 18:25   [place] Carotid Bilateral (at [facility] And Ap Only)  Result Date: 07/19/2018 CLINICAL DATA:  TIA. History of hypertension, hyperlipidemia and diabetes. EXAM: BILATERAL CAROTID DUPLEX ULTRASOUND TECHNIQUE: Wallace CullensGray scale imaging, color Doppler and duplex ultrasound were performed of bilateral carotid and vertebral arteries in the neck. COMPARISON:  None. FINDINGS: Criteria: Quantification of carotid stenosis is based on velocity parameters that correlate the residual internal carotid diameter with NASCET-based stenosis levels, using the diameter of the distal internal carotid lumen as the denominator for stenosis measurement. The following velocity measurements were obtained: RIGHT ICA: 78/19 cm/sec CCA: 94/23 cm/sec SYSTOLIC ICA/CCA RATIO:  0.8 ECA: 86 cm/sec LEFT ICA: 93/26 cm/sec CCA: 73/15 cm/sec SYSTOLIC ICA/CCA RATIO:  1.3 ECA: 98 cm/sec RIGHT CAROTID ARTERY: There is a minimal amount of echogenic plaque within the right carotid bulb (image 15), not resulting in elevated peak systolic velocities within the interrogated course of the right internal carotid artery to suggest a hemodynamically significant stenosis. RIGHT VERTEBRAL ARTERY:  Antegrade flow LEFT CAROTID ARTERY: There is a minimal amount of eccentric hypoechoic plaque within the left carotid bulb (image 46), not resulting in elevated peak systolic velocities within the interrogated course of the left internal carotid artery to suggest a hemodynamically significant stenosis. LEFT VERTEBRAL ARTERY:  Antegrade Flow IMPRESSION: Very minimal amount of bilateral atherosclerotic plaque, right subjectively greater than left, not resulting in a hemodynamically  significant stenosis within either internal carotid artery. Electronically Signed   By: Simonne ComeJohn  Watts M.D.   On: 07/19/2018 09:00   Assessment: 73 y.o female with past medical history of diabetes mellitus, hyperlipidemia, hypertension, anemia, GERD, and Parkinson's disease presenting to the ED on 07/18/2018 with chief complaints of worsening weakness on the left and stiffness. Etiology likely progression of parkinson disease with increased rigidity, bradykinesia, bradyphrenia and cog-wheeling noted on the left.  MRI of the brain reviewed and shows no acute intracranial abnormality.  MRA of head also reviewed with no large vessel occlusions, significant stenosis or aneurysm noted.  Patient is currently being treated with Requip 0.25 mg 4 times daily.  Was recently started on [place] Zosyn however she was unable to tolerate side effects and stopped.  Due to worsening symptoms of Parkinson's disease we will start symptoms management with carbidopa levodopa.  Plan:  1.  Start carbidopa levodopa 25/100 mg IR 1 tab 3 times daily. 2.  Decrease Requip to 0.25 mg 3 times daily 3.  Start melatonin 3 mg p.o. 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  Patient will stay on current regimen at discharge for outpatient neurology to make further adjustment. 5.  PT/OT consult for reevaluation of symptoms   This patient was staffed with Dr. Jonna Munroarek,[place] who personally evaluated patient, reviewed documentation and agreed with assessment and plan of care as above.  Webb SilversmithElizabeth Sierrah Luevano, DNP, FNP-BC Board certified Nurse Practitioner Neurology Department    07/19/2018,  12:55 PM

## 2018-07-20 DIAGNOSIS — T428X5A Adverse effect of antiparkinsonism drugs and other central muscle-tone depressants, initial encounter: Secondary | ICD-10-CM | POA: Diagnosis not present

## 2018-07-20 DIAGNOSIS — E119 Type 2 diabetes mellitus without complications: Secondary | ICD-10-CM | POA: Diagnosis present

## 2018-07-20 DIAGNOSIS — I10 Essential (primary) hypertension: Secondary | ICD-10-CM | POA: Diagnosis present

## 2018-07-20 DIAGNOSIS — G20 Parkinson's disease: Secondary | ICD-10-CM | POA: Diagnosis present

## 2018-07-20 DIAGNOSIS — K219 Gastro-esophageal reflux disease without esophagitis: Secondary | ICD-10-CM | POA: Diagnosis present

## 2018-07-20 DIAGNOSIS — Z66 Do not resuscitate: Secondary | ICD-10-CM | POA: Diagnosis present

## 2018-07-20 DIAGNOSIS — G4752 REM sleep behavior disorder: Secondary | ICD-10-CM | POA: Diagnosis present

## 2018-07-20 DIAGNOSIS — Y92239 Unspecified place in hospital as the place of occurrence of the external cause: Secondary | ICD-10-CM | POA: Diagnosis not present

## 2018-07-20 DIAGNOSIS — E7800 Pure hypercholesterolemia, unspecified: Secondary | ICD-10-CM | POA: Diagnosis present

## 2018-07-20 DIAGNOSIS — E785 Hyperlipidemia, unspecified: Secondary | ICD-10-CM | POA: Diagnosis present

## 2018-07-20 DIAGNOSIS — R110 Nausea: Secondary | ICD-10-CM | POA: Diagnosis not present

## 2018-07-20 DIAGNOSIS — Z7984 Long term (current) use of oral hypoglycemic drugs: Secondary | ICD-10-CM | POA: Diagnosis not present

## 2018-07-20 DIAGNOSIS — Z881 Allergy status to other antibiotic agents status: Secondary | ICD-10-CM | POA: Diagnosis not present

## 2018-07-20 DIAGNOSIS — D649 Anemia, unspecified: Secondary | ICD-10-CM | POA: Diagnosis present

## 2018-07-20 DIAGNOSIS — Z888 Allergy status to other drugs, medicaments and biological substances status: Secondary | ICD-10-CM | POA: Diagnosis not present

## 2018-07-20 DIAGNOSIS — R531 Weakness: Secondary | ICD-10-CM | POA: Diagnosis present

## 2018-07-20 DIAGNOSIS — Z79899 Other long term (current) drug therapy: Secondary | ICD-10-CM | POA: Diagnosis not present

## 2018-07-20 DIAGNOSIS — E876 Hypokalemia: Secondary | ICD-10-CM | POA: Diagnosis present

## 2018-07-20 LAB — BASIC METABOLIC PANEL
Anion gap: 7 (ref 5–15)
BUN: 18 mg/dL (ref 8–23)
CO2: 25 mmol/L (ref 22–32)
Calcium: 8.3 mg/dL — ABNORMAL LOW (ref 8.9–10.3)
Chloride: 102 mmol/L (ref 98–111)
Creatinine, Ser: 0.95 mg/dL (ref 0.44–1.00)
GFR calc Af Amer: >60 mL/min (ref >60)
Glucose, Bld: 116 mg/dL — ABNORMAL HIGH (ref 70–99)
Potassium: 2.7 mmol/L — CL (ref 3.5–5.1)
Sodium: 134 mmol/L — ABNORMAL LOW (ref 135–145)

## 2018-07-20 LAB — MAGNESIUM: Magnesium: 1.3 mg/dL — ABNORMAL LOW (ref 1.7–2.4)

## 2018-07-20 LAB — GLUCOSE, CAPILLARY
Glucose-Capillary: 110 mg/dL — ABNORMAL HIGH (ref 70–99)
Glucose-Capillary: 126 mg/dL — ABNORMAL HIGH (ref 70–99)
Glucose-Capillary: 152 mg/dL — ABNORMAL HIGH (ref 70–99)
Glucose-Capillary: 232 mg/dL — ABNORMAL HIGH (ref 70–99)

## 2018-07-20 MED ORDER — POTASSIUM CHLORIDE CRYS ER 20 MEQ PO TBCR
20.0000 meq | EXTENDED_RELEASE_TABLET | Freq: Two times a day (BID) | ORAL | Status: DC
  Administered 2018-07-21: 08:00:00 20 meq via ORAL
  Filled 2018-07-20: qty 1

## 2018-07-20 MED ORDER — IBUPROFEN 400 MG PO TABS
400.0000 mg | ORAL_TABLET | Freq: Once | ORAL | Status: AC
  Administered 2018-07-20: 01:00:00 400 mg via ORAL
  Filled 2018-07-20: qty 1

## 2018-07-20 MED ORDER — POLYETHYLENE GLYCOL 3350 17 G PO PACK: 17.0000 g | PACK | Freq: Every day | ORAL | Status: DC | PRN

## 2018-07-20 MED ORDER — MELATONIN 5 MG PO TABS
5.0000 mg | ORAL_TABLET | Freq: Every evening | ORAL | Status: DC | PRN
  Administered 2018-07-20: 23:00:00 5 mg via ORAL
  Filled 2018-07-20 (×2): qty 1

## 2018-07-20 MED ORDER — DOCUSATE SODIUM 100 MG PO CAPS
100.0000 mg | ORAL_CAPSULE | Freq: Two times a day (BID) | ORAL | Status: DC
  Administered 2018-07-20 – 2018-07-21 (×2): 100 mg via ORAL
  Filled 2018-07-20 (×2): qty 1

## 2018-07-20 MED ORDER — POTASSIUM CHLORIDE CRYS ER 20 MEQ PO TBCR
60.0000 meq | EXTENDED_RELEASE_TABLET | Freq: Once | ORAL | Status: AC
  Administered 2018-07-20: 60 meq via ORAL
  Filled 2018-07-20: qty 3

## 2018-07-20 MED ORDER — ONDANSETRON HCL 4 MG/5ML PO SOLN
4.0000 mg | Freq: Four times a day (QID) | ORAL | Status: DC | PRN
  Filled 2018-07-20: qty 5

## 2018-07-20 MED ORDER — MAGNESIUM SULFATE 4 GM/100ML IV SOLN
4.0000 g | Freq: Once | INTRAVENOUS | Status: AC
  Administered 2018-07-20: 15:00:00 4 g via INTRAVENOUS
  Filled 2018-07-20: qty 100

## 2018-07-20 MED ORDER — ONDANSETRON HCL 4 MG PO TABS
4.0000 mg | ORAL_TABLET | Freq: Four times a day (QID) | ORAL | Status: DC | PRN
  Administered 2018-07-21: 09:00:00 4 mg via ORAL
  Filled 2018-07-20: qty 1

## 2018-07-20 MED ORDER — NON FORMULARY: 5.0000 mg | Freq: Every evening | Status: DC | PRN

## 2018-07-20 NOTE — Plan of Care (Signed)
  Problem: Education: Goal: Knowledge of disease or condition will improve Outcome: Completed/Met Goal: Knowledge of secondary prevention will improve Outcome: Completed/Met Goal: Knowledge of patient specific risk factors addressed and post discharge goals established will improve Outcome: Completed/Met   Problem: Self-Care: Goal: Ability to participate in self-care as condition permits will improve Outcome: Completed/Met Goal: Verbalization of feelings and concerns over difficulty with self-care will improve Outcome: Completed/Met Goal: Ability to communicate needs accurately will improve Outcome: Completed/Met

## 2018-07-20 NOTE — Care Management (Addendum)
RNCM team met with patient to discuss transition of care planning. Patient currently lives at home alone and has a lift chair for DME. Patient tells me she is getting a walker. Patient has used bedside commode here, I asked if it would be beneficial to have one at home and she said "I hope not." Patients son is able to provide transportation. We discussed that PT rec SNF and patient is not ready to make a decision regarding discharge planning. Patient admits she may benefit from SNF if she does not progress physically but seems to prefer home health. RNCM will follow up again closer to discharge per patient preference to assist with disposition.   *Update 16:24- PT has changed rec to HHPT instead of SNF which was previously the recommendation. RNCM team will meet with patient prior to d/c to discuss agency/DME needs.

## 2018-07-20 NOTE — Progress Notes (Signed)
Physical Therapy Treatment Patient Details Name: Lorraine Wyatt MRN: [id number] DOB: 1945-09-14 Today's Date: 07/20/2018    History of Present Illness Pt presented to ER secondary to L UE weakness; admitted initially for TIA/CVA work up.  Imaging negative for acute infarct.  Symptoms now attributed to worsening of Parkinsons disease per neurology; now started on carbidopa/levodopa for sypmtom management    PT Comments    Neurology NP called therapist to coordinate physical therapy session time with timing of carbidopa-levodopa medications.  Pt SBA supine to sit; CGA with transfers; and CGA progressing to SBA ambulating around nursing loop with walker.  Pt initially unsteady attempting to ambulate without UE support but pt agreeable to trying RW; significant improved balance noted with ambulation using RW and pt reporting feeling much more confident and safe using the walker for ambulation.  BP 116/70-119/69 during session.  No c/o lightheadedness/dizziness.  Significant improvement with functional mobility noted today.  Pt and pt's son educated on therapists recommendations for initial SBA with functional mobility (using RW) for safety and to monitor any "on/off" times with medication (and that she may need more assist at certain times of day).  Pt's son reports he is able to provide assist at home as needed and pt reports she will call her son if she notes change in function and need for assist for safety.  MD and pt's nurse notified of PT's updated recommendation for HHPT.   Follow Up Recommendations  Home health PT;Supervision for mobility/OOB     Equipment Recommendations  Rolling walker with 5" wheels;3in1 (PT)(youth sized RW)    Recommendations for Other Services       Precautions / Restrictions Precautions Precautions: Fall Restrictions Weight Bearing Restrictions: No    Mobility  Bed Mobility Overal bed mobility: Needs Assistance Bed Mobility: Supine to Sit     Supine to  sit: Supervision     General bed mobility comments: bed flat; increased effort to perform on own; use of bedrail  Transfers Overall transfer level: Needs assistance Equipment used: Rolling walker (2 wheeled) Transfers: Sit to/from [employer] Sit to Stand: Min guard Stand pivot transfers: Min guard       General transfer comment: pt initially with difficulty standing from bed (d/t posterior lean) but pt able to problem solve on own and eventually stand up to RW with CGA; standing from [facility] and then again from bed with improved mechanics (no posterior lean noted on these trials); stand step turn bed to/from [facility] with CGA (no UE support)  Ambulation/Gait Ambulation/Gait assistance: Min guard;Supervision Gait Distance (Feet): (15 feet no AD; 180 feet with youth sized RW) Assistive device: Rolling walker (2 wheeled);None(youth sized RW)   Gait velocity: mildly decreased   General Gait Details: pt initially attempting to ambulate without UE support but pt demonstrating increased B lateral sway (with decreased BOS and decreased B step length) requiring CGA for safety; pt requesting to trial RW and pt able to ambulate around nursing loop CGA progressing to SBA (steady without any loss of balance)   Stairs             Wheelchair Mobility    Modified Rankin (Stroke Patients Only)       Balance Overall balance assessment: Needs assistance Sitting-balance support: No upper extremity supported;Feet supported Sitting balance-Leahy Scale: Normal Sitting balance - Comments: steady sitting reaching outside BOS   Standing balance support: No upper extremity supported Standing balance-Leahy Scale: Good Standing balance comment: steady standing washing hands at sink  Cognition Arousal/Alertness: Awake/alert Behavior During Therapy: WFL for tasks assessed/performed Overall Cognitive Status: Within Functional Limits for tasks  assessed                                        Exercises      General Comments   Nursing cleared pt for participation in physical therapy.  Pt agreeable to PT session.  Pt's son present during session.      Pertinent Vitals/Pain Pain Assessment: No/denies pain  HR and O2 sats on room air WFL during session.    Home Living                      Prior Function            PT Goals (current goals can now be found in the care plan section) Acute Rehab PT Goals Patient Stated Goal: to return home PT Goal Formulation: With patient Time For Goal Achievement: 08/02/18 Potential to Achieve Goals: Good Progress towards PT goals: Progressing toward goals    Frequency    Min 2X/week      PT Plan Discharge plan needs to be updated(MD and nurse notified)    Co-evaluation              AM-PAC PT "6 Clicks" Mobility   Outcome Measure  Help needed turning from your back to your side while in a flat bed without using bedrails?: A Little Help needed moving from lying on your back to sitting on the side of a flat bed without using bedrails?: A Little Help needed moving to and from a bed to a chair (including a wheelchair)?: A Little Help needed standing up from a chair using your arms (e.g., wheelchair or bedside chair)?: A Little Help needed to walk in hospital room?: A Little Help needed climbing 3-5 steps with a railing? : A Little 6 Click Score: 18    End of Session Equipment Utilized During Treatment: Gait belt Activity Tolerance: Patient tolerated treatment well Patient left: in chair;with call bell/phone within reach;with chair alarm set;with family/visitor present Nurse Communication: Mobility status;Precautions PT Visit Diagnosis: Unsteadiness on feet (R26.81);Muscle weakness (generalized) (M62.81);Difficulty in walking, not elsewhere classified (R26.2)     Time: 1222-1300 PT Time Calculation (min) (ACUTE ONLY): 38 min  Charges:  $Gait  Training: 8-22 mins $Therapeutic Activity: 23-37 mins                    Hendricks Limes, PT 07/20/18, 2:40 PM [phone]

## 2018-07-20 NOTE — Progress Notes (Signed)
[facility]      PATIENT NAME: Lorraine Wyatt    MR#:  [id number]  DATE OF BIRTH:  25-Jan-1946  SUBJECTIVE:   Left upper ext. Weakness much improved. Pt. Also has improved rigidity on left upper ext.  Pt. Ambulated with PT today and did much better.  Still complains of intermittent nausea  REVIEW OF SYSTEMS:    Review of Systems  Constitutional: Negative for chills and fever.  HENT: Negative for congestion and tinnitus.   Eyes: Negative for blurred vision and double vision.  Respiratory: Negative for cough, shortness of breath and wheezing.   Cardiovascular: Negative for chest pain, orthopnea and PND.  Gastrointestinal: Positive for nausea. Negative for abdominal pain, diarrhea and vomiting.  Genitourinary: Negative for dysuria and hematuria.  Neurological: Positive for weakness (generalized). Negative for dizziness, sensory change and focal weakness.  All other systems reviewed and are negative.   Nutrition: heart Healthy Tolerating Diet: Yes Tolerating PT: Eval noted.   DRUG ALLERGIES:   Allergies  Allergen Reactions  . Clarithromycin   . Erythromycin Ethylsuccinate   . Trandolapril     VITALS:  Blood pressure 129/64, pulse 76, temperature 98.2 F (36.8 C), temperature source Oral, resp. rate 16, height 5\' 3"  (1.6 m), weight 63.5 kg, SpO2 95 %.  PHYSICAL EXAMINATION:   Physical Exam  GENERAL:  73 y.o.-year-old patient lying in bed in no acute distress.  EYES: Pupils equal, round, reactive to light and accommodation. No scleral icterus. Extraocular muscles intact.  HEENT: Head atraumatic, normocephalic. Oropharynx and nasopharynx clear.  NECK:  Supple, no jugular venous distention. No thyroid enlargement, no tenderness.  LUNGS: Good A/e B/l, no wheezing, rales, rhonchi. No use of accessory muscles of respiration.  CARDIOVASCULAR: S1, S2 normal. No murmurs, rubs, or gallops.  ABDOMEN: Soft, nontender, nondistended. Bowel  sounds present. No organomegaly or mass.  EXTREMITIES: No cyanosis, clubbing or edema b/l.    NEUROLOGIC: Cranial nerves II through XII are intact. No focal Motor or sensory deficits b/l.  Globally weak. Left:  Upper extremity rigidity has improved.  Resting tremors also improved.  Left: Increased tone. Able to break gravity PSYCHIATRIC: The patient is alert and oriented x 3.  SKIN: No obvious rash, lesion, or ulcer.    LABORATORY PANEL:   CBC Recent Labs  Lab 07/18/18 1350  WBC 4.3  HGB 11.6*  HCT 36.2  PLT 206   ------------------------------------------------------------------------------------------------------------------  Chemistries  Recent Labs  Lab 07/20/18 0535  NA 134*  K 2.7*  CL 102  CO2 25  GLUCOSE 116*  BUN 18  CREATININE 0.95  CALCIUM 8.3*  MG 1.3*   ------------------------------------------------------------------------------------------------------------------  Cardiac Enzymes No results for input(s): TROPONINI in the last 168 hours. ------------------------------------------------------------------------------------------------------------------  RADIOLOGY:  Ct Head Wo Contrast  Result Date: 07/18/2018 CLINICAL DATA:  Focal neural deficit of greater than 6 hours, history of Parkinson's disease EXAM: CT HEAD WITHOUT CONTRAST TECHNIQUE: Contiguous axial images were obtained from the base of the skull through the vertex without intravenous contrast. COMPARISON:  None. FINDINGS: Brain: The ventricular system is normal in size and configuration, and there is only mild cortical atrophy present. The septum is midline in position. The fourth ventricle and basilar cisterns are unremarkable. No hemorrhage, mass lesion, or acute infarction is seen. There may be very mild small vessel ischemic change throughout the periventricular white matter. Vascular: No vascular abnormality is seen on this unenhanced study. Skull: On bone window images, no calvarial abnormality  is seen.  Sinuses/Orbits: The paranasal sinuses appear well pneumatized. Other: None. IMPRESSION: Minimal cortical atrophy and perhaps mild small vessel ischemic change throughout the periventricular white matter. No acute intracranial abnormality. Electronically Signed   By: Dwyane Dee M.D.   On: 07/18/2018 14:37   [name] KD Contrast  Result Date: 07/18/2018 CLINICAL DATA:  Initial evaluation for acute left lower extremity weakness. EXAM: MRI HEAD WITHOUT CONTRAST MRA HEAD WITHOUT CONTRAST TECHNIQUE: Multiplanar, multiecho pulse sequences of the brain and surrounding structures were obtained without intravenous contrast. Angiographic images of the head were obtained using MRA technique without contrast. COMPARISON:  Prior CT from earlier the same day. FINDINGS: MRI HEAD FINDINGS Brain: Generalized age-related cerebral atrophy. 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 Findings felt to be within normal limits for age. No abnormal foci of restricted diffusion to suggest acute or subacute ischemia. Gray-white matter differentiation maintained. No evidence for chronic infarction. No acute or chronic intracranial hemorrhage. No mass lesion, midline shift or mass effect. No hydrocephalus. No extra fluid collection. Pituitary gland normal. Vascular: Major intracranial vascular flow voids maintained. Skull and upper cervical spine: Craniocervical junction normal. Upper cervical spine normal. Bone marrow signal intensity within normal limits. No scalp soft tissue abnormality. Sinuses/Orbits: Patient status post bilateral ocular lens replacement. Paranasal sinuses are clear. No mastoid effusion. Inner ear structures normal. Other: None. MRA HEAD FINDINGS ANTERIOR CIRCULATION: Distal cervical segments of the internal carotid arteries are widely patent with antegrade  flow. Petrous, cavernous, and supraclinoid segments widely patent without stenosis. A1 segments, anterior communicating artery common anterior cerebral arteries widely patent bilaterally. No M1 stenosis or occlusion. Distal MCA branches well perfused and symmetric. POSTERIOR CIRCULATION: Vertebral arteries widely patent to the vertebrobasilar junction without stenosis. Posterior inferior cerebral arteries patent bilaterally. Basilar widely patent to its distal aspect without stenosis. Superior cerebral arteries patent bilaterally. Right PCA primarily supplied via the basilar. Hypoplastic left P1 with superimposed mild stenosis. Robust and widely patent left posterior communicating artery. PCAs widely patent to their distal aspects without stenosis. No intracranial aneurysm. IMPRESSION: MRI HEAD IMPRESSION: Negative brain MRI. No acute intracranial infarct or other abnormality identified. MRA HEAD IMPRESSION: Negative intracranial MRA. No large vessel occlusion. No hemodynamically significant or correctable stenosis. Electronically Signed   By: Rise Mu M.D.   On: 07/18/2018 18:25   Mr Brain Wo Contrast  Result Date: 07/18/2018 CLINICAL DATA:  Initial evaluation for acute left lower extremity weakness. EXAM: MRI HEAD WITHOUT CONTRAST MRA HEAD WITHOUT CONTRAST TECHNIQUE: Multiplanar, multiecho pulse sequences of the brain and surrounding structures were obtained without intravenous contrast. Angiographic images of the head were obtained using MRA technique without contrast. COMPARISON:  Prior CT from earlier the same day. FINDINGS: MRI HEAD FINDINGS Brain: Generalized age-related cerebral atrophy. 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 Findings felt to be within normal limits for age. No abnormal foci of restricted diffusion to suggest acute or  subacute ischemia. Gray-white matter differentiation maintained. No evidence for chronic infarction. No acute or chronic intracranial hemorrhage. No mass lesion, midline shift or mass effect. No hydrocephalus. No extra fluid collection. Pituitary gland normal. Vascular: Major intracranial vascular flow voids maintained. Skull and upper cervical spine: Craniocervical junction normal. Upper cervical spine normal. Bone marrow signal intensity within normal limits. No scalp soft tissue abnormality. Sinuses/Orbits: Patient status post  bilateral ocular lens replacement. Paranasal sinuses are clear. No mastoid effusion. Inner ear structures normal. Other: None. MRA HEAD FINDINGS ANTERIOR CIRCULATION: Distal cervical segments of the internal carotid arteries are widely patent with antegrade flow. Petrous, cavernous, and supraclinoid segments widely patent without stenosis. A1 segments, anterior communicating artery common anterior cerebral arteries widely patent bilaterally. No M1 stenosis or occlusion. Distal MCA branches well perfused and symmetric. POSTERIOR CIRCULATION: Vertebral arteries widely patent to the vertebrobasilar junction without stenosis. Posterior inferior cerebral arteries patent bilaterally. Basilar widely patent to its distal aspect without stenosis. Superior cerebral arteries patent bilaterally. Right PCA primarily supplied via the basilar. Hypoplastic left P1 with superimposed mild stenosis. Robust and widely patent left posterior communicating artery. PCAs widely patent to their distal aspects without stenosis. No intracranial aneurysm. IMPRESSION: MRI HEAD IMPRESSION: Negative brain MRI. No acute intracranial infarct or other abnormality identified. MRA HEAD IMPRESSION: Negative intracranial MRA. No large vessel occlusion. No hemodynamically significant or correctable stenosis. Electronically Signed   By: Rise MuBenjamin  McClintock M.D.   On: 07/18/2018 18:25   [place] Carotid Bilateral (at [facility]  Only)  Result Date: 07/19/2018 CLINICAL DATA:  TIA. History of hypertension, hyperlipidemia and diabetes. EXAM: BILATERAL CAROTID DUPLEX ULTRASOUND TECHNIQUE: Wallace CullensGray scale imaging, color Doppler and duplex ultrasound were performed of bilateral carotid and vertebral arteries in the neck. COMPARISON:  None. FINDINGS: Criteria: Quantification of carotid stenosis is based on velocity parameters that correlate the residual internal carotid diameter with NASCET-based stenosis levels, using the diameter of the distal internal carotid lumen as the denominator for stenosis measurement. The following velocity measurements were obtained: RIGHT ICA: 78/19 cm/sec CCA: 94/23 cm/sec SYSTOLIC ICA/CCA RATIO:  0.8 ECA: 86 cm/sec LEFT ICA: 93/26 cm/sec CCA: 73/15 cm/sec SYSTOLIC ICA/CCA RATIO:  1.3 ECA: 98 cm/sec RIGHT CAROTID ARTERY: There is a minimal amount of echogenic plaque within the right carotid bulb (image 15), not resulting in elevated peak systolic velocities within the interrogated course of the right internal carotid artery to suggest a hemodynamically significant stenosis. RIGHT VERTEBRAL ARTERY:  Antegrade flow LEFT CAROTID ARTERY: There is a minimal amount of eccentric hypoechoic plaque within the left carotid bulb (image 46), not resulting in elevated peak systolic velocities within the interrogated course of the left internal carotid artery to suggest a hemodynamically significant stenosis. LEFT VERTEBRAL ARTERY:  Antegrade Flow IMPRESSION: Very minimal amount of bilateral atherosclerotic plaque, right subjectively greater than left, not resulting in a hemodynamically significant stenosis within either internal carotid artery. Electronically Signed   By: Simonne ComeJohn  Watts M.D.   On: 07/19/2018 09:00     ASSESSMENT AND PLAN:   73 year old female with past medical history of hypertension, hyperlipidemia, Parkinson's disease, diabetes who presented to the hospital due to lower extremity weakness/difficulty  walking.  1.  Difficulty walking/lower extremity weakness- this has progressed over the past week to 10 days. -Patient admitted to the hospital for work-up for possible stroke which has been negative.  Patient CT head and MRI brain are negative for acute stroke.  Carotid duplex showing no hemodynamically significant carotid stenosis. - Appreciate neurology consult and they think this is likely secondary to patient's progressive Parkinson's.  Patient was on selegiline but could not tolerate it.  On Sinemet and her clinical symptoms have improved.  Patient has improvement in her left upper extremity rigidity and walked better with PT.   - cont. requip and Sinemet.    2. DM - cont. Glipizide,  SSI.  - BS stable.   3. Essential HTN -  cont. Coreg, Losartan  4. Hyperlipidemia - cont. Pravachol.    5. Hypokalemia/hypomagnesemia-replace potassium orally and also give IV magnesium. -Repeat level in the morning.  Possible discharge tomorrow with Home health services.   All the records are reviewed and case discussed with Care Management/Social Worker. Management plans discussed with the patient, family and they are in agreement.  CODE STATUS: DNR  DVT Prophylaxis: Lovenox  TOTAL TIME TAKING CARE OF THIS PATIENT: 30 minutes.   POSSIBLE D/C IN 1-2 DAYS, DEPENDING ON CLINICAL CONDITION.   Houston Siren M.D on 07/20/2018 at 1:51 PM  Between 7am to 6pm - Pager - [phone]  After 6pm go to [email or web address] - Social research officer, government  [facility]  Office  [phone]  CC: Primary care physician; Jerl Mina, MD

## 2018-07-20 NOTE — [place] FL2 (Signed)
[place] MEDICAID FL2 LEVEL OF CARE SCREENING TOOL     IDENTIFICATION  Patient Name: Lorraine Wyatt Birthdate: 09-01-1945 Sex: female Admission Date (Current Location): 07/18/2018  [place] and [place] Number:  Chiropodist and Address:  [facility], [address], [place], [place] [address]      Provider Number: [id number]  Attending Physician Name and Address:  Houston Siren, MD  Relative Name and Phone Number:       Current Level of Care: Hospital Recommended Level of Care: Skilled Nursing Facility Prior Approval Number:    Date Approved/Denied:   PASRR Number: [id number]  Discharge Plan: SNF    Current Diagnoses: Patient Active Problem List   Diagnosis Date Noted  . TIA (transient ischemic attack) 07/18/2018    Orientation RESPIRATION BLADDER Height & Weight     Self, Time, Place, Situation  Normal Continent Weight: 140 lb (63.5 kg) Height:  5\' 3"  (160 cm)  BEHAVIORAL SYMPTOMS/MOOD NEUROLOGICAL BOWEL NUTRITION STATUS  (none) (none) Continent Diet(heart healthy )  AMBULATORY STATUS COMMUNICATION OF NEEDS Skin   Extensive Assist Verbally Normal                       Personal Care Assistance Level of Assistance  Bathing, Feeding, Dressing Bathing Assistance: Limited assistance Feeding assistance: Independent Dressing Assistance: Limited assistance     Functional Limitations Info  Sight, Hearing, Speech Sight Info: Adequate Hearing Info: Adequate Speech Info: Adequate    SPECIAL CARE FACTORS FREQUENCY  PT (By licensed PT), OT (By licensed OT)     PT Frequency: 5 OT Frequency: 5            Contractures Contractures Info: Not present    Additional Factors Info  Code Status, Allergies Code Status Info: DNR Allergies Info: Clarithromycin, Erythromycin Ethylsuccinate, Trandolapril           Current Medications (07/20/2018):  This is the current hospital active medication list Current  Facility-Administered Medications  Medication Dose Route Frequency Provider Last Rate Last Dose  . 0.9 %  sodium chloride infusion   Intravenous Continuous Ihor Austin, MD 75 mL/hr at 07/19/18 0342    . acetaminophen (TYLENOL) tablet 650 mg  650 mg Oral Q4H PRN Ihor Austin, MD   650 mg at 07/20/18 2952   Or  . acetaminophen (TYLENOL) solution 650 mg  650 mg Per Tube Q4H PRN Ihor Austin, MD       Or  . acetaminophen (TYLENOL) suppository 650 mg  650 mg Rectal Q4H PRN Pyreddy, Vivien Rota, MD      . aspirin suppository 300 mg  300 mg Rectal Daily Pyreddy, Pavan, MD       Or  . aspirin tablet 325 mg  325 mg Oral Daily Pyreddy, Vivien Rota, MD   325 mg at 07/20/18 0915  . carbidopa-levodopa (SINEMET IR) 25-250 MG per tablet immediate release 1 tablet  1 tablet Oral TID Jimmye Norman, NP   1 tablet at 07/20/18 0916  . carvedilol (COREG) tablet 3.125 mg  3.125 mg Oral BID WC Pyreddy, Vivien Rota, MD   3.125 mg at 07/20/18 0915  . enoxaparin (LOVENOX) injection 40 mg  40 mg Subcutaneous Q24H Pyreddy, Vivien Rota, MD   40 mg at 07/19/18 2200  . glipiZIDE (GLUCOTROL) tablet 5 mg  5 mg Oral QAC breakfast Ihor Austin, MD   5 mg at 07/20/18 0915  . insulin aspart (novoLOG) injection 0-5 Units  0-5 Units Subcutaneous QHS Ihor Austin, MD  2 Units at 07/18/18 2128  . insulin aspart (novoLOG) injection 0-9 Units  0-9 Units Subcutaneous TID WC Ihor Austin, MD   1 Units at 07/19/18 1303  . losartan (COZAAR) tablet 50 mg  50 mg Oral Daily Pyreddy, Vivien Rota, MD   50 mg at 07/20/18 0915  . pantoprazole (PROTONIX) EC tablet 40 mg  40 mg Oral Daily Pyreddy, Vivien Rota, MD   40 mg at 07/20/18 0915  . pravastatin (PRAVACHOL) tablet 40 mg  40 mg Oral Daily Pyreddy, Pavan, MD   40 mg at 07/19/18 2200  . rOPINIRole (REQUIP) tablet 0.25 mg  0.25 mg Oral TID Jimmye Norman, NP   0.25 mg at 07/20/18 0915     Discharge Medications: Please see discharge summary for a list of discharge medications.  Relevant Imaging  Results:  Relevant Lab Results:   Additional Information SSN: [id number]  Ruthe Mannan, [place]

## 2018-07-20 NOTE — Plan of Care (Signed)

## 2018-07-20 NOTE — Progress Notes (Signed)
Subjective: Patient report that tremors in her left arm has improved. She is also able to move her left leg with less gait freezing and stiffness. She however report nausea after taking Carbidopa- Levodopa lasting 1.5 hours. [name] son is currently at the bedside. Pending PT evaluation this afternoon.  Objective: Current vital signs: BP 129/64 (BP Location: Left Arm)   Pulse 76   Temp 98.2 F (36.8 C) (Oral)   Resp 16   Ht 5\' 3"  (1.6 m)   Wt 63.5 kg   SpO2 95%   BMI 24.80 kg/m  Vital signs in last 24 hours: Temp:  [97.9 F (36.6 C)-98.2 F (36.8 C)] 98.2 F (36.8 C) (01/16 0808) Pulse Rate:  [70-78] 76 (01/16 0808) Resp:  [16-18] 16 (01/16 0808) BP: (105-129)/(56-64) 129/64 (01/16 0808) SpO2:  [95 %-99 %] 95 % (01/16 0808)  Intake/Output from previous day: No intake/output data recorded. Intake/Output this shift: No intake/output data recorded. Nutritional status:  Diet Order            Diet Heart Room service appropriate? Yes; Fluid consistency: Thin  Diet effective now             Neurological Exam  Mental Status: Alert, oriented, thought content appropriate. Speech hypophonic without evidence of aphasia. Able to follow 3 step commands without difficulty. Attention span and concentration seemed appropriate  Cranial Nerves: II: Discs flat bilaterally; Visual fields grossly normal, pupils equal, round, reactive to light and accommodation III,IV, VI: ptosis not present, extra-ocular motions intact bilaterally V,VII: smile symmetric, facial light touch sensationintact. Hypomimia VIII: hearing normal bilaterally IX,X: gag reflex present XI: bilateral shoulder shrug XII: midline tongue extension Motor: Right :Upper extremity 5/5without pronator drift Left:  Upper extremity positive for bradyphrenia, bradykinesia, mild cogwheeling, rigidity improved from previous assessment. Mild resting tremors noted today Right:Lower extremity  5/5 Left: Increased tone. Able to break gravity Sensory: Pinprick and light touchintact bilaterally Deep Tendon Reflexes: 2+ and symmetric throughout Plantars: Right:muteLeft: mute Cerebellar: Finger-to-nosetesting intact on the right. Unable to performHeel to shin testing due to bradykinesia Gait: not tested due to safety concerns  Lab Results: Basic Metabolic Panel: Recent Labs  Lab 07/18/18 1350 07/20/18 0535  NA 134* 134*  K 3.5 2.7*  CL 99 102  CO2 24 25  GLUCOSE 140* 116*  BUN 25* 18  CREATININE 1.20* 0.95  CALCIUM 8.8* 8.3*    Liver Function Tests: No results for input(s): AST, ALT, ALKPHOS, BILITOT, PROT, ALBUMIN in the last 168 hours. No results for input(s): LIPASE, AMYLASE in the last 168 hours. No results for input(s): AMMONIA in the last 168 hours.  CBC: Recent Labs  Lab 07/18/18 1350  WBC 4.3  HGB 11.6*  HCT 36.2  MCV 86.2  PLT 206    Cardiac Enzymes: No results for input(s): CKTOTAL, CKMB, CKMBINDEX, TROPONINI in the last 168 hours.  Lipid Panel: Recent Labs  Lab 07/19/18 0342  CHOL 140  TRIG 138  HDL 44  CHOLHDL 3.2  VLDL 28  LDLCALC 68    CBG: Recent Labs  Lab 07/19/18 1152 07/19/18 1650 07/19/18 1724 07/19/18 2058 07/20/18 0752  GLUCAP 136* 65* 110* 91 110*    Microbiology: No results found for this or any previous visit.  Coagulation Studies: No results for input(s): LABPROT, INR in the last 72 hours.  Imaging: Ct Head Wo Contrast  Result Date: 07/18/2018 CLINICAL DATA:  Focal neural deficit of greater than 6 hours, history of Parkinson's disease EXAM: CT HEAD WITHOUT CONTRAST TECHNIQUE:  Contiguous axial images were obtained from the base of the skull through the vertex without intravenous contrast. COMPARISON:  None. FINDINGS: Brain: The ventricular system is normal in size and configuration, and there is only mild cortical atrophy present. The septum is  midline in position. The fourth ventricle and basilar cisterns are unremarkable. No hemorrhage, mass lesion, or acute infarction is seen. There may be very mild small vessel ischemic change throughout the periventricular white matter. Vascular: No vascular abnormality is seen on this unenhanced study. Skull: On bone window images, no calvarial abnormality is seen. Sinuses/Orbits: The paranasal sinuses appear well pneumatized. Other: None. IMPRESSION: Minimal cortical atrophy and perhaps mild small vessel ischemic change throughout the periventricular white matter. No acute intracranial abnormality. Electronically Signed   By: Dwyane DeePaul  Barry M.D.   On: 07/18/2018 14:37   Mr Shirlee LatchMra Head ZOWo Contrast  Result Date: 07/18/2018 CLINICAL DATA:  Initial evaluation for acute left lower extremity weakness. EXAM: MRI HEAD WITHOUT CONTRAST MRA HEAD WITHOUT CONTRAST TECHNIQUE: Multiplanar, multiecho pulse sequences of the brain and surrounding structures were obtained without intravenous contrast. Angiographic images of the head were obtained using MRA technique without contrast. COMPARISON:  Prior CT from earlier the same day. FINDINGS: MRI HEAD FINDINGS Brain: Generalized age-related cerebral atrophy. 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 Findings felt to be within normal limits for age. No abnormal foci of restricted diffusion to suggest acute or subacute ischemia. Gray-white matter differentiation maintained. No evidence for chronic infarction. No acute or chronic intracranial hemorrhage. No mass lesion, midline shift or mass effect. No hydrocephalus. No extra fluid collection. Pituitary gland normal. Vascular: Major intracranial vascular flow voids maintained. Skull and upper cervical spine: Craniocervical junction normal. Upper cervical spine normal. Bone marrow signal  intensity within normal limits. No scalp soft tissue abnormality. Sinuses/Orbits: Patient status post bilateral ocular lens replacement. Paranasal sinuses are clear. No mastoid effusion. Inner ear structures normal. Other: None. MRA HEAD FINDINGS ANTERIOR CIRCULATION: Distal cervical segments of the internal carotid arteries are widely patent with antegrade flow. Petrous, cavernous, and supraclinoid segments widely patent without stenosis. A1 segments, anterior communicating artery common anterior cerebral arteries widely patent bilaterally. No M1 stenosis or occlusion. Distal MCA branches well perfused and symmetric. POSTERIOR CIRCULATION: Vertebral arteries widely patent to the vertebrobasilar junction without stenosis. Posterior inferior cerebral arteries patent bilaterally. Basilar widely patent to its distal aspect without stenosis. Superior cerebral arteries patent bilaterally. Right PCA primarily supplied via the basilar. Hypoplastic left P1 with superimposed mild stenosis. Robust and widely patent left posterior communicating artery. PCAs widely patent to their distal aspects without stenosis. No intracranial aneurysm. IMPRESSION: MRI HEAD IMPRESSION: Negative brain MRI. No acute intracranial infarct or other abnormality identified. MRA HEAD IMPRESSION: Negative intracranial MRA. No large vessel occlusion. No hemodynamically significant or correctable stenosis. Electronically Signed   By: Rise MuBenjamin  McClintock M.D.   On: 07/18/2018 18:25   Mr Brain Wo Contrast  Result Date: 07/18/2018 CLINICAL DATA:  Initial evaluation for acute left lower extremity weakness. EXAM: MRI HEAD WITHOUT CONTRAST MRA HEAD WITHOUT CONTRAST TECHNIQUE: Multiplanar, multiecho pulse sequences of the brain and surrounding structures were obtained without intravenous contrast. Angiographic images of the head were obtained using MRA technique without contrast. COMPARISON:  Prior CT from earlier the same day. FINDINGS: MRI HEAD FINDINGS  Brain: Generalized age-related cerebral atrophy. 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 Findings felt to be within normal limits for age. No abnormal foci of restricted diffusion to suggest acute or subacute ischemia. Gray-white matter differentiation maintained. No evidence for chronic infarction. No acute or chronic intracranial hemorrhage. No mass lesion, midline shift or mass effect. No hydrocephalus. No extra fluid collection. Pituitary gland normal. Vascular: Major intracranial vascular flow voids maintained. Skull and upper cervical spine: Craniocervical junction normal. Upper cervical spine normal. Bone marrow signal intensity within normal limits. No scalp soft tissue abnormality. Sinuses/Orbits: Patient status post bilateral ocular lens replacement. Paranasal sinuses are clear. No mastoid effusion. Inner ear structures normal. Other: None. MRA HEAD FINDINGS ANTERIOR CIRCULATION: Distal cervical segments of the internal carotid arteries are widely patent with antegrade flow. Petrous, cavernous, and supraclinoid segments widely patent without stenosis. A1 segments, anterior communicating artery common anterior cerebral arteries widely patent bilaterally. No M1 stenosis or occlusion. Distal MCA branches well perfused and symmetric. POSTERIOR CIRCULATION: Vertebral arteries widely patent to the vertebrobasilar junction without stenosis. Posterior inferior cerebral arteries patent bilaterally. Basilar widely patent to its distal aspect without stenosis. Superior cerebral arteries patent bilaterally. Right PCA primarily supplied via the basilar. Hypoplastic left P1 with superimposed mild stenosis. Robust and widely patent left posterior communicating artery. PCAs widely patent to their distal aspects without stenosis. No intracranial aneurysm. IMPRESSION: MRI  HEAD IMPRESSION: Negative brain MRI. No acute intracranial infarct or other abnormality identified. MRA HEAD IMPRESSION: Negative intracranial MRA. No large vessel occlusion. No hemodynamically significant or correctable stenosis. Electronically Signed   By: Rise MuBenjamin  McClintock M.D.   On: 07/18/2018 18:25   [place] Carotid Bilateral (at [facility] And [facility] Only)  Result Date: 07/19/2018 CLINICAL DATA:  TIA. History of hypertension, hyperlipidemia and diabetes. EXAM: BILATERAL CAROTID DUPLEX ULTRASOUND TECHNIQUE: Wallace CullensGray scale imaging, color Doppler and duplex ultrasound were performed of bilateral carotid and vertebral arteries in the neck. COMPARISON:  None. FINDINGS: Criteria: Quantification of carotid stenosis is based on velocity parameters that correlate the residual internal carotid diameter with NASCET-based stenosis levels, using the diameter of the distal internal carotid lumen as the denominator for stenosis measurement. The following velocity measurements were obtained: RIGHT ICA: 78/19 cm/sec CCA: 94/23 cm/sec SYSTOLIC ICA/CCA RATIO:  0.8 ECA: 86 cm/sec LEFT ICA: 93/26 cm/sec CCA: 73/15 cm/sec SYSTOLIC ICA/CCA RATIO:  1.3 ECA: 98 cm/sec RIGHT CAROTID ARTERY: There is a minimal amount of echogenic plaque within the right carotid bulb (image 15), not resulting in elevated peak systolic velocities within the interrogated course of the right internal carotid artery to suggest a hemodynamically significant stenosis. RIGHT VERTEBRAL ARTERY:  Antegrade flow LEFT CAROTID ARTERY: There is a minimal amount of eccentric hypoechoic plaque within the left carotid bulb (image 46), not resulting in elevated peak systolic velocities within the interrogated course of the left internal carotid artery to suggest a hemodynamically significant stenosis. LEFT VERTEBRAL ARTERY:  Antegrade Flow IMPRESSION: Very minimal amount of bilateral atherosclerotic plaque, right subjectively greater than left, not resulting in a hemodynamically  significant stenosis within either internal carotid artery. Electronically Signed   By: Simonne ComeJohn  Watts M.D.   On: 07/19/2018 09:00   Medications:  I have reviewed the patient's current medications. Prior to Admission:  Medications Prior to Admission  Medication Sig Dispense Refill Last Dose  . carvedilol (COREG) 3.125 MG tablet Take 3.125 mg by mouth 2 (two) times daily with a meal.   07/18/2018 at 0700  . glipiZIDE (GLUCOTROL) 5 MG tablet Take 5 mg by mouth  daily before breakfast.    07/18/2018 at 0700  . losartan (COZAAR) 50 MG tablet Take 50 mg by mouth daily.   07/18/2018 at 0700  . metFORMIN (GLUCOPHAGE) 1000 MG tablet Take 1,000 mg by mouth 2 (two) times daily with a meal.   07/18/2018 at 0700  . omeprazole (PRILOSEC) 20 MG capsule Take 20 mg by mouth daily.   07/18/2018 at 0700  . pravastatin (PRAVACHOL) 40 MG tablet Take 40 mg by mouth daily.   07/17/2018 at 2100  . rOPINIRole (REQUIP) 0.25 MG tablet Take 0.25 mg by mouth 3 (three) times daily.   07/18/2018 at 0700  . selegiline (ELDEPRYL) 5 MG capsule Take 5 mg by mouth daily.    07/18/2018 at 0700  . sitaGLIPtin (JANUVIA) 100 MG tablet Take 100 mg by mouth daily.   07/18/2018 at 0700  . triamterene-hydrochlorothiazide (MAXZIDE-25) 37.5-25 MG tablet Take 1 tablet by mouth daily.   07/18/2018 at 07000   Scheduled: . aspirin  300 mg Rectal Daily   Or  . aspirin  325 mg Oral Daily  . carbidopa-levodopa  1 tablet Oral TID  . carvedilol  3.125 mg Oral BID WC  . enoxaparin (LOVENOX) injection  40 mg Subcutaneous Q24H  . glipiZIDE  5 mg Oral QAC breakfast  . insulin aspart  0-5 Units Subcutaneous QHS  . insulin aspart  0-9 Units Subcutaneous TID WC  . losartan  50 mg Oral Daily  . pantoprazole  40 mg Oral Daily  . pravastatin  40 mg Oral Daily  . rOPINIRole  0.25 mg Oral TID   Assessment: 73 y.o female with past medical history of diabetes mellitus, hyperlipidemia, hypertension, anemia, GERD, and Parkinson's disease presenting to the ED on  07/18/2018 with chief complaints of worsening weakness on the left and stiffness. Etiology likely progression of parkinson disease with increased rigidity, bradykinesia, bradyphrenia and cog-wheeling noted on the left.  MRI of the brain reviewed and shows no acute intracranial abnormality.  MRA of head also reviewed with no large vessel occlusions, significant stenosis or aneurysm noted.  Patient is currently being treated with Requip 0.25 mg 4 times daily.  Was recently started on Terazosin however she was unable to tolerate side effects and stopped.  Carbidopa levodopa 25/100 mg IR started with improvements in symptoms noted.  Plan: 1. Continue carbidopa levodopa 25/100 mg IR 1 tab 3 times daily. May take with food if causing nausea.Patient will stay on current regimen at discharge for outpatient neurology to make further adjustment. 2. Zofran 4 mg as needed for nausea  3. Continue Requip 0.25 mg 3 times daily 4. Melatonin 3 mg p.o. 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 PT/OT consult for reevaluation of symptoms  6. 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 Sometimes medications such as midodrine or florinef may be required to manage hypotension.  This patient was staffed with Dr. Jonna Munro who personally evaluated patient, reviewed documentation and agreed with assessment and plan of care as above.  Webb Silversmith, DNP, FNP-BC Board certified Nurse Practitioner Neurology Department   LOS: 0 days   07/20/2018  10:06 AM

## 2018-07-20 NOTE — Clinical Social Work Note (Signed)
Clinical Social Work Assessment  Patient Details  Name: Lorraine Wyatt MRN: [id number] Date of Birth: [birth year]  Date of referral:  07/20/18               Reason for consult:  Facility Placement                Permission sought to share information with:  Case Manager, Customer service manager, Family Supports Permission granted to share information::  Yes, Verbal Permission Granted  Name::        Agency::     Relationship::     Contact Information:     Housing/Transportation Living arrangements for the past 2 months:  Single Family Home Source of Information:  Patient Patient Interpreter Needed:  None Criminal Activity/Legal Involvement Pertinent to Current Situation/Hospitalization:  No - Comment as needed Significant Relationships:  Adult Children Lives with:  Self Do you feel safe going back to the place where you live?  Yes Need for family participation in patient care:  Yes (Comment)  Care giving concerns:  Patient lives alone    Social Worker assessment / plan:  CSW consulted for SNF placement. CSW met with patient and son Hazell Siwik at bedside. CSW introduced self and explained role. Patient reports that she lives alone and has never been to a SNF before. CSW explained PT recommendation of SNF. Patient reports that she doesn't want to go to facility. CSW explained Medicare guidelines. Patient is currently in observation and Medicare requires a 3 night inpatient stay. CSW explained that if patient does want to go to SNF that she would have to pay out of pocket. Patient states that she will not be doing that and will go home. CSW asked about home health. Patient is agreeable to home health if insurance will cover it. Patient also had questions about observation. CSW notified RNCM of above. RNCM will follow up with patient.   Employment status:  Retired Forensic scientist:  [employer] PT Recommendations:  [place] / Referral to community  resources:  [place]  Patient/Family's Response to care:  Patient thanked CSW for assistance   Patient/Family's Understanding of and Emotional Response to Diagnosis, Current Treatment, and Prognosis:  Patient understands diagnoses and treatment   Emotional Assessment Appearance:  Appears stated age Attitude/Demeanor/Rapport:    Affect (typically observed):  Blunt, Flat, Withdrawn Orientation:  Oriented to Self, Oriented to Place, Oriented to  Time Alcohol / Substance use:  Not Applicable Psych involvement (Current and /or in the community):  No (Comment)  Discharge Needs  Concerns to be addressed:  Discharge Planning Concerns Readmission within the last 30 days:  No Current discharge risk:  Dependent with Mobility, Lives alone Barriers to Discharge:  Continued Medical Work up   [employer], [place] 07/20/2018, 9:22 AM

## 2018-07-20 NOTE — Progress Notes (Signed)
CRITICAL VALUE ALERT  Critical Value:  K+ 2.7  Date & Time Notied:  07/20/2018  Provider Notified: Salary  Orders Received/Actions taken: No new orders at this time. Will followup

## 2018-07-21 LAB — BASIC METABOLIC PANEL
Anion gap: 7 (ref 5–15)
BUN: 18 mg/dL (ref 8–23)
CO2: 24 mmol/L (ref 22–32)
Calcium: 8.2 mg/dL — ABNORMAL LOW (ref 8.9–10.3)
Chloride: 105 mmol/L (ref 98–111)
Creatinine, Ser: 0.77 mg/dL (ref 0.44–1.00)
GFR calc Af Amer: >60 mL/min (ref >60)
GFR calc non Af Amer: >60 mL/min (ref >60)
Glucose, Bld: 111 mg/dL — ABNORMAL HIGH (ref 70–99)
POTASSIUM: 3.3 mmol/L — AB (ref 3.5–5.1)
Sodium: 136 mmol/L (ref 135–145)

## 2018-07-21 LAB — GLUCOSE, CAPILLARY
Glucose-Capillary: 121 mg/dL — ABNORMAL HIGH (ref 70–99)
Glucose-Capillary: 117 mg/dL — ABNORMAL HIGH (ref 70–99)

## 2018-07-21 LAB — MAGNESIUM: Magnesium: 2.3 mg/dL (ref 1.7–2.4)

## 2018-07-21 MED ORDER — CARBIDOPA-LEVODOPA 25-250 MG PO TABS: 1.0000 | ORAL_TABLET | Freq: Three times a day (TID) | ORAL | 1 refills | Status: AC

## 2018-07-21 MED ORDER — ONDANSETRON HCL 4 MG PO TABS: 4.0000 mg | ORAL_TABLET | Freq: Three times a day (TID) | ORAL | 0 refills | Status: DC | PRN

## 2018-07-21 MED ORDER — POTASSIUM CHLORIDE CRYS ER 20 MEQ PO TBCR: 20.0000 meq | EXTENDED_RELEASE_TABLET | Freq: Two times a day (BID) | ORAL | 0 refills | Status: DC

## 2018-07-21 NOTE — Care Management Note (Addendum)
Case Management Note  Patient Details  Name: Lorraine Wyatt MRN: [id number] Date of Birth: [birth year]  Subjective/Objective:                 Patient and her son are agreeable to home health at discharge.  Agency preference is [facility] [employer] [facility].  Patient has access to a front wheeled walker and bedside commode. Instructed son and patient that walker must be in use at the time of discharge and not wait to find it.  Verbalized understanding. Her son will be staying with patient  Action/Plan:   Referral called to [facility] and accepted for RN PT Aide Expected Discharge Date:                  Expected Discharge Plan:     In-House Referral:     Discharge planning Services  CM Consult  Post Acute Care Choice:  Home Health Choice offered to:  Patient, Adult Children  DME Arranged:    DME Agency:     HH Arranged:  RN, PT Aide HH Agency:     Status of Service:     If discussed at Long Length of Stay Meetings, dates discussed:    Additional Comments:  Lorraine Wyatt, [place], RN 07/21/2018, 10:22 AM

## 2018-07-21 NOTE — Progress Notes (Signed)
Occupational Therapy Treatment Patient Details Name: Lorraine Wyatt MRN: [id number] DOB: Oct 24, 1945 Today's Date: 07/21/2018    History of present illness Pt presented to ER secondary to L UE weakness; admitted initially for TIA/CVA work up.  Imaging negative for acute infarct.  Symptoms now attributed to worsening of Parkinsons disease per neurology; now started on carbidopa/levodopa for sypmtom management   OT comments  Pt. has improved with LUE ROM, LUE functioning, and functional mobility since the last OT session. Education was provided to the pt. And her son about A/E use for LE ADLs. Pt. demonstrated reacher use for donning pants, and doffing pants, and socks. Pt. reports that she plans to return home today. Pt. Continues to benefit from OT services for ADL training, A/E training, and pt. Education about home modification, and DME. Pt. Plans to return home upon discharge with family to assist pt. as needed. Pt. Could benefit from follow-up HHOT services upon discharge.   Follow Up Recommendations  Home health OT    Equipment Recommendations  3 in 1 bedside commode;Tub/shower seat    Recommendations for Other Services      Precautions / Restrictions Precautions Precautions: Fall Restrictions Weight Bearing Restrictions: No       Mobility Bed Mobility Overal bed mobility: Needs Assistance Bed Mobility: Supine to Sit     Supine to sit: Supervision        Transfers                      Balance                                           ADL either performed or assessed with clinical judgement   ADL Overall ADL's : Needs assistance/impaired Eating/Feeding: Set up;Independent   Grooming: Set up;Independent   Upper Body Bathing: Set up;Independent   Lower Body Bathing: Set up;Minimal assistance   Upper Body Dressing : Set up;Independent   Lower Body Dressing: Set up;Minimal assistance                 General ADL Comments: Pt.  edcuation was provided about A/E use for LE ADLS.     Vision Baseline Vision/History: Wears glasses Wears Glasses: Reading only Patient Visual Report: No change from baseline     Perception     Praxis      Cognition Arousal/Alertness: Awake/alert Behavior During Therapy: WFL for tasks assessed/performed Overall Cognitive Status: Within Functional Limits for tasks assessed                                          Exercises     Shoulder Instructions       General Comments      Pertinent Vitals/ Pain       Pain Assessment: No/denies pain  Home Living                                          Prior Functioning/Environment              Frequency  Min 2X/week        Progress Toward Goals  OT Goals(current goals can now be found in  the care plan section)  Progress towards OT goals: Progressing toward goals  Acute Rehab OT Goals Patient Stated Goal: to return home Potential to Achieve Goals: Good  Plan Discharge plan needs to be updated    Co-evaluation                 AM-PAC OT "6 Clicks" Daily Activity     Outcome Measure   Help from another person eating meals?: None Help from another person taking care of personal grooming?: None Help from another person toileting, which includes using toliet, bedpan, or urinal?: A Little Help from another person bathing (including washing, rinsing, drying)?: A Little Help from another person to put on and taking off regular upper body clothing?: A Little Help from another person to put on and taking off regular lower body clothing?: A Little 6 Click Score: 20    End of Session    OT Visit Diagnosis: Unsteadiness on feet (R26.81);Muscle weakness (generalized) (M62.81)   Activity Tolerance Patient tolerated treatment well   Patient Left in bed;with family/visitor present;in chair;with chair alarm set   Nurse Communication          Time: 9390-3009 OT Time  Calculation (min): 20 min  Charges: OT General Charges $OT Visit: 1 Visit OT Treatments $Self Care/Home Management : 8-22 mins  Olegario Messier, MS, OTR/L    Olegario Messier 07/21/2018, 12:03 PM

## 2018-07-21 NOTE — Progress Notes (Signed)
Received MD order to discharge patient to home, reviewed home meds, discharge instructions, follow up appointments and prescriptions with patient and patient verbalized understanding  

## 2018-07-21 NOTE — Discharge Summary (Signed)
[facility] [place] at [facility]   PATIENT NAME: Lorraine Wyatt    MR#:  [id number]  DATE OF BIRTH:  [birth year]  DATE OF ADMISSION:  07/18/2018 ADMITTING PHYSICIAN: Ihor Austin, MD  DATE OF DISCHARGE: 07/21/2018  PRIMARY CARE PHYSICIAN: Jerl Mina, MD    ADMISSION DIAGNOSIS:  Acute left-sided weakness [R53.1]  DISCHARGE DIAGNOSIS:  Active Problems:   TIA (transient ischemic attack)   SECONDARY DIAGNOSIS:   Past Medical History:  Diagnosis Date  . Diabetes mellitus without complication (HCC)   . Hyperlipidemia   . Hypertension   . Parkinson disease [facility])     HOSPITAL COURSE:   73 year old female with past medical history of hypertension, hyperlipidemia, Parkinson's disease, diabetes who presented to the hospital due to lower extremity weakness/difficulty walking.  1.  Difficulty walking/lower extremity weakness- this had progressed over the past week to 10 days and so she came to the hospital.  -Patient admitted to the hospital for work-up for possible stroke whichhas been negative.  Patient CT head and MRI brain are negative for acute stroke.  Carotid duplex showing no hemodynamically significant carotid stenosis. 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  Patient apparently was on selegiline but not tolerating it.  She was started on some Sinemet and her symptoms have significantly improved.  She has less rigidity on her left arm and is able to ambulate with the help of a walker and is doing much better. -She is being discharged on the Sinemet with follow-up with her outpatient neurologist.  She will continue her Requip.  Cotherapy with ambulation recommended home health services therefore home health physical therapy is being arranged for her.`  2. DM - while in the hospital pt. Was on SSI but will resume her Metformin, Glipizide, Januvia upon discharge.   3. Essential HTN - she will  cont. Coreg, Losartan  4. Hyperlipidemia - she will cont. Pravachol.    5. Hypokalemia/hypomagnesemia-improved and resolved with supplementation and pt. Will cont. Potassium supplements for 5 more days.     DISCHARGE CONDITIONS:   Stable.   CONSULTS OBTAINED:  Treatment Team:  Kym Groom, MD Jonna Munro, MD  DRUG ALLERGIES:   Allergies  Allergen Reactions  . Clarithromycin   . Erythromycin Ethylsuccinate   . Trandolapril     DISCHARGE MEDICATIONS:   Allergies as of 07/21/2018      Reactions   Clarithromycin    Erythromycin Ethylsuccinate    Trandolapril       Medication List    STOP taking these medications   selegiline 5 MG capsule Commonly known as:  ELDEPRYL     TAKE these medications   carbidopa-levodopa 25-250 MG tablet Commonly known as:  SINEMET IR Take 1 tablet by mouth 3 (three) times daily.   carvedilol 3.125 MG tablet Commonly known as:  COREG Take 3.125 mg by mouth 2 (two) times daily with a meal.   glipiZIDE 5 MG tablet Commonly known as:  GLUCOTROL Take 5 mg by mouth daily before breakfast.   losartan 50 MG tablet Commonly known as:  COZAAR Take 50 mg by mouth daily.   metFORMIN 1000 MG tablet Commonly known as:  GLUCOPHAGE Take 1,000 mg by mouth 2 (two) times daily with a meal.   omeprazole 20 MG capsule Commonly known as:  PRILOSEC Take 20 mg by mouth daily.   ondansetron 4 MG tablet Commonly known as:  ZOFRAN Take 1 tablet (4 mg total) by mouth  every 8 (eight) hours as needed for nausea or vomiting.   potassium chloride SA 20 MEQ tablet Commonly known as:  K-DUR,KLOR-CON Take 1 tablet (20 mEq total) by mouth 2 (two) times daily for 5 days.   pravastatin 40 MG tablet Commonly known as:  PRAVACHOL Take 40 mg by mouth daily.   rOPINIRole 0.25 MG tablet Commonly known as:  REQUIP Take 0.25 mg by mouth 3 (three) times daily.   sitaGLIPtin 100 MG tablet Commonly known as:  JANUVIA Take 100 mg by mouth daily.    triamterene-hydrochlorothiazide 37.5-25 MG tablet Commonly known as:  MAXZIDE-25 Take 1 tablet by mouth daily.         DISCHARGE INSTRUCTIONS:   DIET:  Cardiac diet and Diabetic diet  DISCHARGE CONDITION:  Stable  ACTIVITY:  Activity as tolerated  OXYGEN:  Home Oxygen: No.   Oxygen Delivery: room air  DISCHARGE LOCATION:  Home with Home Health PT, RN, Aide.     If you experience worsening of your admission symptoms, develop shortness of breath, life threatening emergency, suicidal or homicidal thoughts you must seek medical attention immediately by calling 911 or calling your MD immediately  if symptoms less severe.  You Must read complete instructions/literature along with all the possible adverse reactions/side effects for all the Medicines you take and that have been prescribed to you. Take any new Medicines after you have completely understood and accpet all the possible adverse reactions/side effects.   Please note  You were cared for by a hospitalist during your hospital stay. If you have any questions about your discharge medications or the care you received while you were in the hospital after you are discharged, you can call the unit and asked to speak with the hospitalist on call if the hospitalist that took care of you is not available. Once you are discharged, your primary care physician will handle any further medical issues. Please note that NO REFILLS for any discharge medications will be authorized once you are discharged, as it is imperative that you return to your primary care physician (or establish a relationship with a primary care physician if you do not have one) for your aftercare needs so that they can reassess your need for medications and monitor your lab values.     Today   Patient overall feels much better.  Cogwheel rigidity in the left upper extremity much improved.  Ambulated well with physical therapy yesterday.  Potassium level has also  improved since yesterday.  Son is at bedside and is agreement for discharge to home with home health services today.  VITAL SIGNS:  Blood pressure 122/65, pulse 73, temperature 98 F (36.7 C), temperature source Oral, resp. rate 18, height 5\' 3"  (1.6 m), weight 63.5 kg, SpO2 98 %.  I/O:    Intake/Output Summary (Last 24 hours) at 07/21/2018 1413 Last data filed at 07/21/2018 1106 Gross per 24 hour  Intake 796.96 ml  Output 1300 ml  Net -503.04 ml    PHYSICAL EXAMINATION:   GENERAL:  73 y.o.-year-old patient lying in bed in no acute distress.  EYES: Pupils equal, round, reactive to light and accommodation. No scleral icterus. Extraocular muscles intact.  HEENT: Head atraumatic, normocephalic. Oropharynx and nasopharynx clear.  NECK:  Supple, no jugular venous distention. No thyroid enlargement, no tenderness.  LUNGS: Good A/e B/l, no wheezing, rales, rhonchi. No use of accessory muscles of respiration.  CARDIOVASCULAR: S1, S2 normal. No murmurs, rubs, or gallops.  ABDOMEN: Soft, nontender, nondistended. Bowel sounds  present. No organomegaly or mass.  EXTREMITIES: No cyanosis, clubbing or edema b/l.    NEUROLOGIC: Cranial nerves II through XII are intact. No focal Motor or sensory deficits b/l.  Globally weak. Left:Upper extremity rigidity mucg improved.  Resting tremors also improved. PSYCHIATRIC: The patient is alert and oriented x 3.  SKIN: No obvious rash, lesion, or ulcer.   DATA REVIEW:   CBC Recent Labs  Lab 07/18/18 1350  WBC 4.3  HGB 11.6*  HCT 36.2  PLT 206    Chemistries  Recent Labs  Lab 07/21/18 0447  NA 136  K 3.3*  CL 105  CO2 24  GLUCOSE 111*  BUN 18  CREATININE 0.77  CALCIUM 8.2*  MG 2.3    Cardiac Enzymes No results for input(s): TROPONINI in the last 168 hours.  Microbiology Results  No results found for this or any previous visit.  RADIOLOGY:  No results found.    Management plans discussed with the patient, family and they are in  agreement.  CODE STATUS:     Code Status Orders  (From admission, onward)         Start     Ordered   07/18/18 1645  Do not attempt resuscitation (DNR)  Continuous    Question Answer Comment  In the event of cardiac or respiratory ARREST Do not call a "code blue"   In the event of cardiac or respiratory ARREST Do not perform Intubation, CPR, defibrillation or ACLS   In the event of cardiac or respiratory ARREST Use medication by any route, position, wound care, and other measures to relive pain and suffering. May use oxygen, suction and manual treatment of airway obstruction as needed for comfort.      07/18/18 1644        TOTAL TIME TAKING CARE OF THIS PATIENT: 40 minutes.    Houston Siren M.D on 07/21/2018 at 2:13 PM  Between 7am to 6pm - Pager - [phone]  After 6pm go to [email or web address] - Social research officer, government  [facility] [place] [facility]  Office  [phone]  CC: Primary care physician; Jerl Mina, MD

## 2019-05-03 ENCOUNTER — Other Ambulatory Visit: Payer: Self-pay

## 2019-05-03 ENCOUNTER — Encounter: Payer: Self-pay | Admitting: Emergency Medicine

## 2019-05-03 ENCOUNTER — Emergency Department: Payer: Medicare Other

## 2019-05-03 ENCOUNTER — Emergency Department
Admission: EM | Admit: 2019-05-03 | Discharge: 2019-05-03 | Disposition: A | Discharge status: Home / Self Care | Payer: Medicare Other | Attending: Emergency Medicine | Admitting: Emergency Medicine

## 2019-05-03 DIAGNOSIS — R1031 Right lower quadrant pain: Secondary | ICD-10-CM | POA: Insufficient documentation

## 2019-05-03 DIAGNOSIS — E119 Type 2 diabetes mellitus without complications: Secondary | ICD-10-CM | POA: Diagnosis not present

## 2019-05-03 DIAGNOSIS — R112 Nausea with vomiting, unspecified: Secondary | ICD-10-CM | POA: Diagnosis not present

## 2019-05-03 DIAGNOSIS — R110 Nausea: Secondary | ICD-10-CM

## 2019-05-03 DIAGNOSIS — Z7984 Long term (current) use of oral hypoglycemic drugs: Secondary | ICD-10-CM | POA: Diagnosis not present

## 2019-05-03 DIAGNOSIS — I10 Essential (primary) hypertension: Secondary | ICD-10-CM | POA: Insufficient documentation

## 2019-05-03 DIAGNOSIS — G20 Parkinson's disease: Secondary | ICD-10-CM | POA: Diagnosis not present

## 2019-05-03 DIAGNOSIS — R1032 Left lower quadrant pain: Secondary | ICD-10-CM

## 2019-05-03 DIAGNOSIS — Z79899 Other long term (current) drug therapy: Secondary | ICD-10-CM | POA: Diagnosis not present

## 2019-05-03 LAB — COMPREHENSIVE METABOLIC PANEL
ALT: 6 U/L (ref 0–44)
AST: 14 U/L — ABNORMAL LOW (ref 15–41)
Albumin: 4.5 g/dL (ref 3.5–5.0)
Alkaline Phosphatase: 46 U/L (ref 38–126)
Anion gap: 14 (ref 5–15)
BUN: 18 mg/dL (ref 8–23)
CO2: 27 mmol/L (ref 22–32)
Calcium: 9.7 mg/dL (ref 8.9–10.3)
Chloride: 87 mmol/L — ABNORMAL LOW (ref 98–111)
Creatinine, Ser: 1.03 mg/dL — ABNORMAL HIGH (ref 0.44–1.00)
GFR calc Af Amer: >60 mL/min (ref >60)
GFR calc non Af Amer: 54 mL/min — ABNORMAL LOW (ref >60)
Glucose, Bld: 107 mg/dL — ABNORMAL HIGH (ref 70–99)
Potassium: 3.2 mmol/L — ABNORMAL LOW (ref 3.5–5.1)
Sodium: 128 mmol/L — ABNORMAL LOW (ref 135–145)
Total Bilirubin: 0.8 mg/dL (ref 0.3–1.2)
Total Protein: 7.6 g/dL (ref 6.5–8.1)

## 2019-05-03 LAB — CBC
HCT: 34.6 % — ABNORMAL LOW (ref 36.0–46.0)
Hemoglobin: 12.1 g/dL (ref 12.0–15.0)
MCH: 28.3 pg (ref 26.0–34.0)
MCHC: 35 g/dL (ref 30.0–36.0)
MCV: 81 fL (ref 80.0–100.0)
Platelets: 310 10*3/uL (ref 150–400)
RBC: 4.27 MIL/uL (ref 3.87–5.11)
RDW: 13 % (ref 11.5–15.5)
WBC: 9.4 10*3/uL (ref 4.0–10.5)
nRBC: 0 % (ref 0.0–0.2)

## 2019-05-03 LAB — URINALYSIS, COMPLETE (UACMP) WITH MICROSCOPIC
Bacteria, UA: NONE SEEN
Bilirubin Urine: NEGATIVE
Glucose, UA: NEGATIVE mg/dL
Hgb urine dipstick: NEGATIVE
Ketones, ur: 5 mg/dL — AB
Nitrite: NEGATIVE
Protein, ur: NEGATIVE mg/dL
Specific Gravity, Urine: 1.043 — ABNORMAL HIGH (ref 1.005–1.030)
pH: 6 (ref 5.0–8.0)

## 2019-05-03 LAB — LIPASE, BLOOD: Lipase: 30 U/L (ref 11–51)

## 2019-05-03 MED ORDER — ONDANSETRON HCL 4 MG/2ML IJ SOLN
4.0000 mg | Freq: Once | INTRAMUSCULAR | Status: AC
  Administered 2019-05-03: 4 mg via INTRAVENOUS
  Filled 2019-05-03: qty 2

## 2019-05-03 MED ORDER — IOHEXOL 9 MG/ML PO SOLN
1000.0000 mL | Freq: Once | ORAL | Status: AC | PRN
  Administered 2019-05-03: 1000 mL via ORAL

## 2019-05-03 MED ORDER — IOHEXOL 300 MG/ML  SOLN
100.0000 mL | Freq: Once | INTRAMUSCULAR | Status: AC | PRN
  Administered 2019-05-03: 20:00:00 100 mL via INTRAVENOUS

## 2019-05-03 MED ORDER — SODIUM CHLORIDE 0.9% FLUSH: 3.0000 mL | Freq: Once | INTRAVENOUS | Status: DC

## 2019-05-03 MED ORDER — ONDANSETRON 4 MG PO TBDP: 4.0000 mg | ORAL_TABLET | Freq: Three times a day (TID) | ORAL | 0 refills | Status: DC | PRN

## 2019-05-03 NOTE — ED Triage Notes (Signed)
Pt presents to ED via wheelchair from [facility], pt c/o LLQ abdominal pressure and nausea x 3 weeks. Pt states "it's more nausea than pain". Pt is A&O at this time. Pt states difficulty have a BM at this time.

## 2019-05-03 NOTE — Discharge Instructions (Addendum)
As we discussed please follow-up with your doctor next week as scheduled.  Please increase your intake of fluids as well as food over the next several days.  Use your nausea medication as needed, as written.  Return to the emergency department for any significant weakness, return of abdominal pain, fever, or any other symptom personally concerning to yourself.

## 2019-05-03 NOTE — ED Notes (Signed)
Pt given water at this time 

## 2019-05-03 NOTE — ED Provider Notes (Signed)
[facility] Emergency Department Provider Note  Time seen: 7:42 PM  I have reviewed the triage vital signs and the nursing notes.   HISTORY  Chief Complaint Abdominal Pain   HPI Lorraine Wyatt is a 73 y.o. female with a past medical history of diabetes, hypertension, hyperlipidemia, Parkinson's, presents to the emergency department for left lower quadrant abdominal pain nausea vomiting.  According to the patient over the past week or 2 she has been experiencing discomfort in left lower quadrant along with nausea and vomiting on nearly a daily basis for the last 1 week.  Patient states she has been quite constipated over the past 1 week as well, did have a very small bowel movement last night after using laxatives and a suppository but states that was her first bowel movement in over a week.  Denies any dysuria or hematuria.  No diarrhea.  No fever cough or shortness of breath.   Past Medical History:  Diagnosis Date  . Diabetes mellitus without complication (HCC)   . Hyperlipidemia   . Hypertension   . Parkinson disease [facility](HCC)     Patient Active Problem List   Diagnosis Date Noted  . TIA (transient ischemic attack) 07/18/2018    Past Surgical History:  Procedure Laterality Date  . COLONOSCOPY WITH PROPOFOL N/A 12/12/2017   Procedure: COLONOSCOPY WITH PROPOFOL;  Surgeon: Scot JunElliott, Robert T, MD;  [facility];  Service: Endoscopy;  Laterality: N/A;  . ESOPHAGOGASTRODUODENOSCOPY (EGD) WITH PROPOFOL N/A 12/12/2017   Procedure: ESOPHAGOGASTRODUODENOSCOPY (EGD) WITH PROPOFOL;  Surgeon: Scot JunElliott, Robert T, MD;  [facility];  Service: Endoscopy;  Laterality: N/A;    Prior to Admission medications   Medication Sig Start Date End Date Taking? Authorizing Provider  carbidopa-levodopa (SINEMET IR) 25-250 MG tablet Take 1 tablet by mouth 3 (three) times daily. 07/21/18 09/19/18  Houston SirenSainani, Vivek J, MD  carvedilol (COREG) 3.125 MG tablet Take 3.125 mg  by mouth 2 (two) times daily with a meal.    [provider]  glipiZIDE (GLUCOTROL) 5 MG tablet Take 5 mg by mouth daily before breakfast.     [provider]  losartan (COZAAR) 50 MG tablet Take 50 mg by mouth daily.    [provider]  metFORMIN (GLUCOPHAGE) 1000 MG tablet Take 1,000 mg by mouth 2 (two) times daily with a meal.    [provider]  omeprazole (PRILOSEC) 20 MG capsule Take 20 mg by mouth daily.    [provider]  ondansetron (ZOFRAN) 4 MG tablet Take 1 tablet (4 mg total) by mouth every 8 (eight) hours as needed for nausea or vomiting. 07/21/18   Houston SirenSainani, Vivek J, MD  potassium chloride SA (K-DUR,KLOR-CON) 20 MEQ tablet Take 1 tablet (20 mEq total) by mouth 2 (two) times daily for 5 days. 07/21/18 07/26/18  Houston SirenSainani, Vivek J, MD  pravastatin (PRAVACHOL) 40 MG tablet Take 40 mg by mouth daily.    [provider]  rOPINIRole (REQUIP) 0.25 MG tablet Take 0.25 mg by mouth 3 (three) times daily.    [provider]  sitaGLIPtin (JANUVIA) 100 MG tablet Take 100 mg by mouth daily.    [provider]  triamterene-hydrochlorothiazide (MAXZIDE-25) 37.5-25 MG tablet Take 1 tablet by mouth daily.    [provider]    Allergies  Allergen Reactions  . Clarithromycin   . Erythromycin Ethylsuccinate   . Trandolapril     No family history on file.  Social History Social History   Tobacco Use  .  Smoking status: Never Smoker  . Smokeless tobacco: Never Used  Substance Use Topics  . Alcohol use: Not Currently  . Drug use: Never    Review of Systems Constitutional: Negative for fever. Cardiovascular: Negative for chest pain. Respiratory: Negative for shortness of breath. Gastrointestinal: Left lower quadrant abdominal pain.  Positive for nausea vomiting.  Positive for constipation. Genitourinary: Negative for urinary compaints Musculoskeletal: Negative for musculoskeletal complaints Skin: Negative for  skin complaints  Neurological: Negative for headache All other ROS negative  ____________________________________________   PHYSICAL EXAM:  VITAL SIGNS: ED Triage Vitals  Enc Vitals Group     BP 05/03/19 1717 (!) 145/63     Pulse Rate 05/03/19 1717 90     Resp 05/03/19 1717 20     Temp 05/03/19 1717 98 F (36.7 C)     Temp Source 05/03/19 1717 Oral     SpO2 05/03/19 1717 100 %     Weight 05/03/19 1718 140 lb (63.5 kg)     Height 05/03/19 1718 5\' 5"  (1.651 m)     Head Circumference --      Peak Flow --      Pain Score 05/03/19 1718 4     Pain Loc --      Pain Edu? --      Excl. in GC? --     Constitutional: Alert and oriented. Well appearing and in no distress. Eyes: Normal exam ENT      Head: Normocephalic and atraumatic.      Mouth/Throat: Mucous membranes are moist. Cardiovascular: Normal rate, regular rhythm.  Respiratory: Normal respiratory effort without tachypnea nor retractions. Breath sounds are clear Gastrointestinal: Soft abdomen mild left lower quadrant tenderness palpation.  No rebound guarding or distention.  Patient does appear to have a small bulge to her left lower quadrant possibly consistent with an inguinal hernia with mild tenderness over this area, no skin color changes. Musculoskeletal: Nontender with normal range of motion in all extremities. Neurologic:  Normal speech and language. No gross focal neurologic deficits  Skin:  Skin is warm, dry and intact.  Psychiatric: Mood and affect are normal.   ____________________________________________    RADIOLOGY  Negative for any acute finding.  ____________________________________________   INITIAL IMPRESSION / ASSESSMENT AND PLAN / ED COURSE  Pertinent labs & imaging results that were available during my care of the patient were reviewed by me and considered in my medical decision making (see chart for details).   Patient presents to the emergency department for left lower quadrant abdominal  pain nausea and vomiting over the past 1 week.  Differential would include diverticulitis, colitis, inguinal hernia, SBO.  Overall the patient appears well.  We will check labs proceed with CT imaging and continue to closely monitor.  Patient agreeable to plan of care.  Overall patient appears well.  No bacteria, in her urine, no urinary symptoms we will hold off on antibiotics and send a urine culture as of caution.  [name] sodium is on the lower end 128, states he has not been eating very much recently, her baseline sodium appears to be around 134.  We will dose IV fluids in the emergency department.  Discussed with the patient need to increase oral intake.  We will discharge with Zofran to be used as needed.  Patient strongly wishes to go home.  Given otherwise reassuring work-up I believe the patient would be safe for discharge home at this time.  05/05/19 was evaluated in Emergency Department on  05/03/2019 for the symptoms described in the history of present illness. She was evaluated in the context of the global COVID-19 pandemic, which necessitated consideration that the patient might be at risk for infection with the SARS-CoV-2 virus that causes COVID-19. Institutional protocols and algorithms that pertain to the evaluation of patients at risk for COVID-19 are in a state of rapid change based on information released by regulatory bodies including the CDC and federal and state organizations. These policies and algorithms were followed during the patient's care in the ED.  ____________________________________________   FINAL CLINICAL IMPRESSION(S) / ED DIAGNOSES  Left lower quadrant abdominal pain   Harvest Dark, MD 05/03/19 2227

## 2019-05-03 NOTE — ED Notes (Signed)
First RN Note: pt presents to ED via wheelchair from [facility] with c/o LLQ abdominal pain and nausea x 1 week.
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
## 2019-05-03 NOTE — ED Notes (Signed)
Patient transported to CT 

## 2019-05-04 LAB — URINE CULTURE

## 2019-07-27 ENCOUNTER — Other Ambulatory Visit: Payer: Self-pay | Admitting: Family Medicine

## 2019-07-27 DIAGNOSIS — Z1231 Encounter for screening mammogram for malignant neoplasm of breast: Secondary | ICD-10-CM

## 2019-08-22 ENCOUNTER — Ambulatory Visit
Admission: RE | Admit: 2019-08-22 | Discharge: 2019-08-22 | Disposition: A | Discharge status: Home / Self Care | Payer: Medicare Other | Source: Ambulatory Visit | Attending: Family Medicine | Admitting: Family Medicine

## 2019-08-22 DIAGNOSIS — Z1231 Encounter for screening mammogram for malignant neoplasm of breast: Secondary | ICD-10-CM | POA: Insufficient documentation

## 2019-12-25 DIAGNOSIS — E119 Type 2 diabetes mellitus without complications: Secondary | ICD-10-CM

## 2021-02-27 ENCOUNTER — Other Ambulatory Visit: Payer: Self-pay | Admitting: Orthopedic Surgery

## 2021-02-27 DIAGNOSIS — R102 Pelvic and perineal pain: Secondary | ICD-10-CM

## 2021-02-27 DIAGNOSIS — M25551 Pain in right hip: Secondary | ICD-10-CM

## 2021-03-24 ENCOUNTER — Ambulatory Visit: Payer: Medicare Other

## 2021-03-27 ENCOUNTER — Other Ambulatory Visit: Payer: Self-pay

## 2021-03-27 ENCOUNTER — Emergency Department: Payer: Medicare Other

## 2021-03-27 ENCOUNTER — Inpatient Hospital Stay
Admission: EM | Admit: 2021-03-27 | Discharge: 2021-03-29 | DRG: 641 | Disposition: A | Discharge status: Home Health | Payer: Medicare Other | Attending: Internal Medicine | Admitting: Internal Medicine

## 2021-03-27 DIAGNOSIS — G20A1 Parkinson's disease without dyskinesia, without mention of fluctuations: Secondary | ICD-10-CM | POA: Diagnosis present

## 2021-03-27 DIAGNOSIS — E860 Dehydration: Secondary | ICD-10-CM | POA: Diagnosis present

## 2021-03-27 DIAGNOSIS — R339 Retention of urine, unspecified: Secondary | ICD-10-CM

## 2021-03-27 DIAGNOSIS — Z20822 Contact with and (suspected) exposure to covid-19: Secondary | ICD-10-CM | POA: Diagnosis present

## 2021-03-27 DIAGNOSIS — E876 Hypokalemia: Secondary | ICD-10-CM | POA: Diagnosis present

## 2021-03-27 DIAGNOSIS — R32 Unspecified urinary incontinence: Secondary | ICD-10-CM | POA: Diagnosis present

## 2021-03-27 DIAGNOSIS — G20 Parkinson's disease: Secondary | ICD-10-CM | POA: Diagnosis present

## 2021-03-27 DIAGNOSIS — Z7984 Long term (current) use of oral hypoglycemic drugs: Secondary | ICD-10-CM | POA: Diagnosis not present

## 2021-03-27 DIAGNOSIS — R296 Repeated falls: Secondary | ICD-10-CM | POA: Diagnosis present

## 2021-03-27 DIAGNOSIS — G459 Transient cerebral ischemic attack, unspecified: Secondary | ICD-10-CM | POA: Diagnosis present

## 2021-03-27 DIAGNOSIS — E782 Mixed hyperlipidemia: Secondary | ICD-10-CM | POA: Diagnosis present

## 2021-03-27 DIAGNOSIS — I10 Essential (primary) hypertension: Secondary | ICD-10-CM | POA: Diagnosis present

## 2021-03-27 DIAGNOSIS — K219 Gastro-esophageal reflux disease without esophagitis: Secondary | ICD-10-CM | POA: Diagnosis present

## 2021-03-27 DIAGNOSIS — E871 Hypo-osmolality and hyponatremia: Principal | ICD-10-CM | POA: Diagnosis present

## 2021-03-27 DIAGNOSIS — E119 Type 2 diabetes mellitus without complications: Secondary | ICD-10-CM | POA: Diagnosis present

## 2021-03-27 DIAGNOSIS — N390 Urinary tract infection, site not specified: Secondary | ICD-10-CM | POA: Diagnosis present

## 2021-03-27 DIAGNOSIS — Z79899 Other long term (current) drug therapy: Secondary | ICD-10-CM

## 2021-03-27 DIAGNOSIS — E861 Hypovolemia: Secondary | ICD-10-CM | POA: Diagnosis present

## 2021-03-27 DIAGNOSIS — R531 Weakness: Secondary | ICD-10-CM

## 2021-03-27 LAB — CBC
HCT: 31.1 % — ABNORMAL LOW (ref 36.0–46.0)
Hemoglobin: 10.6 g/dL — ABNORMAL LOW (ref 12.0–15.0)
MCH: 27.6 pg (ref 26.0–34.0)
MCHC: 34.1 g/dL (ref 30.0–36.0)
MCV: 81 fL (ref 80.0–100.0)
Platelets: 292 10*3/uL (ref 150–400)
RBC: 3.84 MIL/uL — ABNORMAL LOW (ref 3.87–5.11)
RDW: 13.9 % (ref 11.5–15.5)
WBC: 8.3 10*3/uL (ref 4.0–10.5)
nRBC: 0 % (ref 0.0–0.2)

## 2021-03-27 LAB — COMPREHENSIVE METABOLIC PANEL
ALT: <5 U/L (ref 0–44)
AST: 17 U/L (ref 15–41)
Albumin: 4.2 g/dL (ref 3.5–5.0)
Alkaline Phosphatase: 53 U/L (ref 38–126)
Anion gap: 13 (ref 5–15)
BUN: 23 mg/dL (ref 8–23)
CO2: 25 mmol/L (ref 22–32)
Calcium: 9.1 mg/dL (ref 8.9–10.3)
Chloride: 86 mmol/L — ABNORMAL LOW (ref 98–111)
Creatinine, Ser: 1.23 mg/dL — ABNORMAL HIGH (ref 0.44–1.00)
GFR, Estimated: 46 mL/min — ABNORMAL LOW (ref >60)
Glucose, Bld: 108 mg/dL — ABNORMAL HIGH (ref 70–99)
Potassium: 3.4 mmol/L — ABNORMAL LOW (ref 3.5–5.1)
Sodium: 124 mmol/L — ABNORMAL LOW (ref 135–145)
Total Bilirubin: 0.7 mg/dL (ref 0.3–1.2)
Total Protein: 7 g/dL (ref 6.5–8.1)

## 2021-03-27 LAB — CBC WITH DIFFERENTIAL/PLATELET
Abs Immature Granulocytes: 0.03 10*3/uL (ref 0.00–0.07)
Basophils Absolute: 0 10*3/uL (ref 0.0–0.1)
Basophils Relative: 0 %
Eosinophils Absolute: 0 10*3/uL (ref 0.0–0.5)
Eosinophils Relative: 1 %
HCT: 31.8 % — ABNORMAL LOW (ref 36.0–46.0)
Hemoglobin: 11 g/dL — ABNORMAL LOW (ref 12.0–15.0)
Immature Granulocytes: 0 %
Lymphocytes Relative: 23 %
Lymphs Abs: 1.8 10*3/uL (ref 0.7–4.0)
MCH: 28.4 pg (ref 26.0–34.0)
MCHC: 34.6 g/dL (ref 30.0–36.0)
MCV: 82.2 fL (ref 80.0–100.0)
Monocytes Absolute: 0.7 10*3/uL (ref 0.1–1.0)
Monocytes Relative: 9 %
Neutro Abs: 5.3 10*3/uL (ref 1.7–7.7)
Neutrophils Relative %: 67 %
Platelets: 329 10*3/uL (ref 150–400)
RBC: 3.87 MIL/uL (ref 3.87–5.11)
RDW: 14 % (ref 11.5–15.5)
WBC: 7.9 10*3/uL (ref 4.0–10.5)
nRBC: 0 % (ref 0.0–0.2)

## 2021-03-27 LAB — URINALYSIS, COMPLETE (UACMP) WITH MICROSCOPIC
Bilirubin Urine: NEGATIVE
Glucose, UA: NEGATIVE mg/dL
Ketones, ur: 5 mg/dL — AB
Nitrite: NEGATIVE
Protein, ur: NEGATIVE mg/dL
Specific Gravity, Urine: 1.014 (ref 1.005–1.030)
pH: 5 (ref 5.0–8.0)

## 2021-03-27 LAB — GLUCOSE, CAPILLARY: Glucose-Capillary: 136 mg/dL — ABNORMAL HIGH (ref 70–99)

## 2021-03-27 LAB — CREATININE, SERUM
Creatinine, Ser: 1.02 mg/dL — ABNORMAL HIGH (ref 0.44–1.00)
GFR, Estimated: 58 mL/min — ABNORMAL LOW (ref >60)

## 2021-03-27 MED ORDER — SODIUM CHLORIDE 0.9 % IV SOLN
1.0000 g | INTRAVENOUS | Status: DC
  Administered 2021-03-28: 1 g via INTRAVENOUS
  Filled 2021-03-27 (×2): qty 10

## 2021-03-27 MED ORDER — SODIUM CHLORIDE 0.9 % IV BOLUS
1000.0000 mL | Freq: Once | INTRAVENOUS | Status: AC
  Administered 2021-03-27: 1000 mL via INTRAVENOUS

## 2021-03-27 MED ORDER — SODIUM CHLORIDE 0.9 % IV SOLN
1.0000 g | Freq: Once | INTRAVENOUS | Status: AC
  Administered 2021-03-27: 1 g via INTRAVENOUS
  Filled 2021-03-27: qty 10

## 2021-03-27 MED ORDER — SODIUM CHLORIDE 0.9 % IV SOLN: INTRAVENOUS | Status: DC

## 2021-03-27 MED ORDER — INSULIN ASPART 100 UNIT/ML IJ SOLN: 0.0000 [IU] | Freq: Every day | INTRAMUSCULAR | Status: DC

## 2021-03-27 MED ORDER — ENOXAPARIN SODIUM 40 MG/0.4ML IJ SOSY
40.0000 mg | PREFILLED_SYRINGE | Freq: Every day | INTRAMUSCULAR | Status: DC
  Administered 2021-03-27 – 2021-03-28 (×2): 40 mg via SUBCUTANEOUS
  Filled 2021-03-27 (×2): qty 0.4

## 2021-03-27 MED ORDER — ONDANSETRON HCL 4 MG PO TABS: 4.0000 mg | ORAL_TABLET | Freq: Four times a day (QID) | ORAL | Status: DC | PRN

## 2021-03-27 MED ORDER — INSULIN ASPART 100 UNIT/ML IJ SOLN
0.0000 [IU] | Freq: Three times a day (TID) | INTRAMUSCULAR | Status: DC
  Administered 2021-03-28: 2 [IU] via SUBCUTANEOUS
  Administered 2021-03-28 – 2021-03-29 (×3): 1 [IU] via SUBCUTANEOUS
  Filled 2021-03-27 (×4): qty 1

## 2021-03-27 MED ORDER — ONDANSETRON HCL 4 MG/2ML IJ SOLN: 4.0000 mg | Freq: Four times a day (QID) | INTRAMUSCULAR | Status: DC | PRN

## 2021-03-27 MED ORDER — ACETAMINOPHEN 500 MG PO TABS
500.0000 mg | ORAL_TABLET | Freq: Four times a day (QID) | ORAL | Status: DC | PRN
  Administered 2021-03-27 – 2021-03-29 (×4): 500 mg via ORAL
  Filled 2021-03-27 (×4): qty 1

## 2021-03-27 NOTE — H&P (Signed)
History and Physical   CARLTON SWEANEY GYI:[id number] DOB: 04/21/1946 DOA: 03/27/2021  Referring MD/NP/PA: Dr. Antoine Primas  PCP: Jerl Mina, MD   Outpatient Specialists: Dr. Rushie Goltz, cardiology  Patient coming from: Home  Chief Complaint: Urinary retention  HPI: Lorraine Wyatt is a 75 y.o. female with medical history significant of diabetes, hypertension, hyperlipidemia, Parkinson's disease on treatment who presents to the ER originally complaining of urinary retention.  Symptoms apparently started around midnight.  She had ST read but could not go.  Patient had no urinary output.  She came into the ER complaining of dribbling then.  She was seen and evaluated.  Patient was bladder scanned and had only 100 cc in height bladder.  No bladder spasms.  No evidence of hydronephrosis or any evidence of outlet obstruction. Patient reported several falls over the last 1 month.  She was also seen by PCP yesterday at which point her sodium was 126.  In the ER today work-up done showed sodium of 124.  Patient is therefore being admitted to the hospital with complaint of generalized weakness, urinary retention but significant hyponatremia.    ED Course: Temperature is 97.9, blood pressure 163/86, pulse 117, respiratory of 22 and oxygen sat 98% on room air.  White count is 8.3 hemoglobin 10.6 platelets 292.  Sodium 124 potassium 3.4 chloride 86 CO2 25 BUN 23 creatinine 1.02 and calcium 9.1.  Urinalysis showed WBC WBC 21-50 many bacteria  Review of Systems: As per HPI otherwise 10 point review of systems negative.    Past Medical History:  Diagnosis Date   Diabetes mellitus without complication (HCC)    Hyperlipidemia    Hypertension    Parkinson disease (HCC)     Past Surgical History:  Procedure Laterality Date   BREAST CYST ASPIRATION Right    COLONOSCOPY WITH PROPOFOL N/A 12/12/2017   Procedure: COLONOSCOPY WITH PROPOFOL;  Surgeon: Scot Jun, MD;  Location: [facility] ENDOSCOPY;  Service:  Endoscopy;  Laterality: N/A;   ESOPHAGOGASTRODUODENOSCOPY (EGD) WITH PROPOFOL N/A 12/12/2017   Procedure: ESOPHAGOGASTRODUODENOSCOPY (EGD) WITH PROPOFOL;  Surgeon: Scot Jun, MD;  Location: [facility] ENDOSCOPY;  Service: Endoscopy;  Laterality: N/A;     reports that she has never smoked. She has never used smokeless tobacco. She reports that she does not currently use alcohol. She reports that she does not use drugs.  Allergies  Allergen Reactions   Clarithromycin    Erythromycin Ethylsuccinate    Trandolapril     History reviewed. No pertinent family history.   Prior to Admission medications   Medication Sig Start Date End Date Taking? Authorizing Provider  carbidopa-levodopa (SINEMET IR) 25-250 MG tablet Take 1 tablet by mouth 3 (three) times daily. 07/21/18 03/27/21 Yes Sainani, Rolly Pancake, MD  carvedilol (COREG) 3.125 MG tablet Take 3.125 mg by mouth 2 (two) times daily with a meal.   Yes [provider]  cyanocobalamin (,VITAMIN B-12,) 1000 MCG/ML injection Inject 1 mL into the muscle every 30 (thirty) days. 09/08/20  Yes [provider]  glipiZIDE (GLUCOTROL) 5 MG tablet Take 5 mg by mouth daily before breakfast.    Yes [provider]  losartan (COZAAR) 25 MG tablet Take 25 mg by mouth daily. 03/19/21  Yes [provider]  metFORMIN (GLUCOPHAGE) 1000 MG tablet Take 1,000 mg by mouth 2 (two) times daily with a meal.   Yes [provider]  omeprazole (PRILOSEC) 20 MG capsule Take 20 mg by mouth daily.   Yes [provider]  pravastatin (PRAVACHOL) 40 MG tablet Take 40 mg by mouth daily.   Yes [provider]  rOPINIRole (REQUIP) 0.25 MG tablet Take 0.25 mg by mouth 3 (three) times daily.   Yes [provider]  sitaGLIPtin (JANUVIA) 100 MG tablet Take 100 mg by mouth daily.   Yes [provider]  benztropine (COGENTIN) 1 MG tablet Take 1 tablet by mouth daily. Patient not taking: Reported on 03/27/2021 01/20/21    [provider]  losartan (COZAAR) 50 MG tablet Take 50 mg by mouth daily. Patient not taking: Reported on 03/27/2021    [provider]  ondansetron (ZOFRAN ODT) 4 MG disintegrating tablet Take 1 tablet (4 mg total) by mouth every 8 (eight) hours as needed for nausea or vomiting. Patient not taking: No sig reported 05/03/19   Minna Antis, MD  ondansetron (ZOFRAN) 4 MG tablet Take 1 tablet (4 mg total) by mouth every 8 (eight) hours as needed for nausea or vomiting. Patient not taking: No sig reported 07/21/18   Houston Siren, MD  potassium chloride SA (K-DUR,KLOR-CON) 20 MEQ tablet Take 1 tablet (20 mEq total) by mouth 2 (two) times daily for 5 days. Patient not taking: No sig reported 07/21/18 07/26/18  Houston Siren, MD  triamterene-hydrochlorothiazide (MAXZIDE-25) 37.5-25 MG tablet Take 1 tablet by mouth daily. Patient not taking: Reported on 03/27/2021    [provider]    Physical Exam: Vitals:   03/27/21 1510 03/27/21 1511 03/27/21 1745  BP: (!) 152/75  (!) 142/97  Pulse: (!) 117  92  Resp: 18  18  Temp: 97.9 F (36.6 C)    TempSrc: Oral    SpO2: 99%  99%  Weight:  53.5 kg   Height:  5\' 4"  (1.626 m)       Constitutional: Chronically ill looking no distress Vitals:   03/27/21 1510 03/27/21 1511 03/27/21 1745  BP: (!) 152/75  (!) 142/97  Pulse: (!) 117  92  Resp: 18  18  Temp: 97.9 F (36.6 C)    TempSrc: Oral    SpO2: 99%  99%  Weight:  53.5 kg   Height:  5\' 4"  (1.626 m)    Eyes: PERRL, lids and conjunctivae normal ENMT: Mucous membranes are moist. Posterior pharynx clear of any exudate or lesions.Normal dentition.  Neck: normal, supple, no masses, no thyromegaly Respiratory: clear to auscultation bilaterally, no wheezing, no crackles. Normal respiratory effort. No accessory muscle use.  Cardiovascular: Sinus tachycardia, no murmurs / rubs / gallops. No extremity edema. 2+ pedal pulses. No carotid bruits.  Abdomen: no  tenderness, no masses palpated. No hepatosplenomegaly. Bowel sounds positive.  Musculoskeletal: no clubbing / cyanosis. No joint deformity upper and lower extremities. Good ROM, no contractures. Normal muscle tone.  Skin: no rashes, lesions, ulcers. No induration Neurologic: CN 2-12 grossly intact. Sensation intact, DTR normal. Strength 5/5 in all 4.  Psychiatric: Normal judgment and insight. Alert and oriented x 3. Normal mood.     Labs on Admission: I have personally reviewed following labs and imaging studies  CBC: Recent Labs  Lab 03/27/21 1519  WBC 7.9  NEUTROABS 5.3  HGB 11.0*  HCT 31.8*  MCV 82.2  PLT 329   Basic Metabolic Panel: Recent Labs  Lab 03/27/21 1519  NA 124*  K 3.4*  CL 86*  CO2 25  GLUCOSE 108*  BUN 23  CREATININE 1.23*  CALCIUM 9.1   GFR: Estimated Creatinine Clearance: 33.9 mL/min (A) (by C-G formula based on SCr of  1.23 mg/dL (H)). Liver Function Tests: Recent Labs  Lab 03/27/21 1519  AST 17  ALT <5  ALKPHOS 53  BILITOT 0.7  PROT 7.0  ALBUMIN 4.2   No results for input(s): LIPASE, AMYLASE in the last 168 hours. No results for input(s): AMMONIA in the last 168 hours. Coagulation Profile: No results for input(s): INR, PROTIME in the last 168 hours. Cardiac Enzymes: No results for input(s): CKTOTAL, CKMB, CKMBINDEX, TROPONINI in the last 168 hours. BNP (last 3 results) No results for input(s): PROBNP in the last 8760 hours. HbA1C: No results for input(s): HGBA1C in the last 72 hours. CBG: No results for input(s): GLUCAP in the last 168 hours. Lipid Profile: No results for input(s): CHOL, HDL, LDLCALC, TRIG, CHOLHDL, LDLDIRECT in the last 72 hours. Thyroid Function Tests: No results for input(s): TSH, T4TOTAL, FREET4, T3FREE, THYROIDAB in the last 72 hours. Anemia Panel: No results for input(s): VITAMINB12, FOLATE, FERRITIN, TIBC, IRON, RETICCTPCT in the last 72 hours. Urine analysis:    Component Value Date/Time   COLORURINE  YELLOW (A) 03/27/2021 1812   APPEARANCEUR HAZY (A) 03/27/2021 1812   LABSPEC 1.014 03/27/2021 1812   PHURINE 5.0 03/27/2021 1812   GLUCOSEU NEGATIVE 03/27/2021 1812   HGBUR SMALL (A) 03/27/2021 1812   BILIRUBINUR NEGATIVE 03/27/2021 1812   KETONESUR 5 (A) 03/27/2021 1812   PROTEINUR NEGATIVE 03/27/2021 1812   NITRITE NEGATIVE 03/27/2021 1812   LEUKOCYTESUR SMALL (A) 03/27/2021 1812   Sepsis Labs: @LABRCNTIP (procalcitonin:4,lacticidven:4) )No results found for this or any previous visit (from the past 240 hour(s)).   Radiological Exams on Admission: Renal  Result Date: 03/27/2021 CLINICAL DATA:  Urinary retention. EXAM: RENAL / URINARY TRACT ULTRASOUND COMPLETE COMPARISON:  October 25, 2013 FINDINGS: Right Kidney: Renal measurements: 10.9 cm x 5.5 cm x 5.0 cm = volume: 157.1 mL. Echogenicity within normal limits. No mass or hydronephrosis visualized. Left Kidney: Renal measurements: 8.2 cm x 4.1 cm x 3.6 cm = volume: 6.1 mL. Mild renal cortical thinning is noted. Echogenicity within normal limits. No mass or hydronephrosis visualized. Bladder: Appears normal for degree of bladder distention. The right and left ureteral jets are not visualized. Other: The study is limited secondary to patient motion. IMPRESSION: Mild left renal cortical thinning. Electronically Signed   By: October 27, 2013 M.D.   On: 03/27/2021 17:36      Assessment/Plan Principal Problem:   Hyponatremia Active Problems:   TIA (transient ischemic attack)   Acute lower UTI   Essential hypertension   Mixed hyperlipidemia   Parkinson's disease (HCC)   Type 2 diabetes mellitus (HCC)   Urinary incontinence in female     #1 hyponatremia: Appears symptomatic.  Patient has had recurrent falls probably from that.  Admit the patient and hydrate.  Most likely secondary to dehydration.  Monitor sodium levels.  Has serum osmolarity and urine osmolarity to be checked.  #2 hypokalemia: Potassium 3.4 will replete  #3  essential hypertension: Hold diuretics.  #4 Parkinson's disease: On carbidopa levodopa and Cogentin.  Resume.  #5 UTI: Empiric antibiotics with Rocephin.  #6 GERD: Continue with PPIs  #7 reported urinary retention: Patient unable to urinate but not much in the bladder.  Patient most likely has dehydration leading to the poor urine output.  After hydration will monitor.  Patient has Foley catheter inserted in the ER.  We will continue to monitor.  #8 hyperlipidemia: Patient has pravastatin.  #9 diabetes: Patient has been on glipizide, metformin and Januvia.  We will hold the Januvia  and metformin.  Sliding scale insulin.    DVT prophylaxis: Lovenox Code Status: Full code Family Communication: No family at bedside Disposition Plan: Home Consults called: None Admission status: Inpatient  Severity of Illness: The appropriate patient status for this patient is INPATIENT. Inpatient status is judged to be reasonable and necessary in order to provide the required intensity of service to ensure the patient's safety. The patient's presenting symptoms, physical exam findings, and initial radiographic and laboratory data in the context of their chronic comorbidities is felt to place them at high risk for further clinical deterioration. Furthermore, it is not anticipated that the patient will be medically stable for discharge from the hospital within 2 midnights of admission. The following factors support the patient status of inpatient.   " The patient's presenting symptoms include recurrent fall. " The worrisome physical exam findings include dry mucous membranes. " The initial radiographic and laboratory data are worrisome because of no significant finding on exam. " The chronic co-morbidities include pruritus of the.   * I certify that at the point of admission it is my clinical judgment that the patient will require inpatient hospital care spanning beyond 2 midnights from the point of  admission due to high intensity of service, high risk for further deterioration and high frequency of surveillance required.Lonia Blood MD [facility] Pager [phone]  If 7PM-7AM, please contact night-coverage [email or web address] Password [facility]  03/27/2021, 8:06 PM

## 2021-03-27 NOTE — ED Notes (Signed)
Ultrasound tech at bedside

## 2021-03-27 NOTE — ED Notes (Signed)
Pt still unable to urinate. Will place foley catheter per order

## 2021-03-27 NOTE — ED Notes (Signed)
Pt stating she thinks she can urinate. Pt assisted to bathroom. [place] stating bladder scan showed approximately 

## 2021-03-27 NOTE — ED Provider Notes (Signed)
[facility] Emergency Department Provider Note  ____________________________________________  Time seen: Approximately 4:13 PM  I have reviewed the triage vital signs and the nursing notes.   HISTORY  Chief Complaint Urinary Retention    HPI Lorraine Wyatt is a 75 y.o. female who presents the emergency department complaining of urinary retention.  Patient states that this began at midnight, she has the urge to urinate but cannot urinate.  She states that she has had no urinary output including "dribbling" since midnight.  This is very atypical for the patient.  She does not have any pain in her bladder but does have the urge to urinate.  No flank pain.  There was no urinary changes prior to the onset of retention.  No history of retention.  She denies any falls, back pain, lumbar radiculopathy, saddle anesthesia, paresthesias.  Patient is also complaining of some weakness.  She had several falls a few weeks ago, had been evaluated by primary care and had labs performed yesterday which revealed a sodium of 126.  Patient became more weak today in addition to her urinary retention and she was advised to follow-up in the emergency department for her sodium plus urinary retention.  Today's sodium returns at 124.  Patient denies any headache, visual changes, fevers or chills, URI symptoms, chest pain, shortness of breath, abdominal pain, nausea vomiting, diarrhea or constipation.  History of diabetes, hypertension and Parkinson's.       Past Medical History:  Diagnosis Date   Diabetes mellitus without complication (HCC)    Hyperlipidemia    Hypertension    Parkinson disease (HCC)     Patient Active Problem List   Diagnosis Date Noted   Hyponatremia 03/27/2021   TIA (transient ischemic attack) 07/18/2018    Past Surgical History:  Procedure Laterality Date   BREAST CYST ASPIRATION Right    COLONOSCOPY WITH PROPOFOL N/A 12/12/2017   Procedure: COLONOSCOPY WITH  PROPOFOL;  Surgeon: Scot Jun, MD;  Location: [facility] ENDOSCOPY;  Service: Endoscopy;  Laterality: N/A;   ESOPHAGOGASTRODUODENOSCOPY (EGD) WITH PROPOFOL N/A 12/12/2017   Procedure: ESOPHAGOGASTRODUODENOSCOPY (EGD) WITH PROPOFOL;  Surgeon: Scot Jun, MD;  Location: [facility] ENDOSCOPY;  Service: Endoscopy;  Laterality: N/A;    Prior to Admission medications   Medication Sig Start Date End Date Taking? Authorizing Provider  carbidopa-levodopa (SINEMET IR) 25-250 MG tablet Take 1 tablet by mouth 3 (three) times daily. 07/21/18 09/19/18  Houston Siren, MD  carvedilol (COREG) 3.125 MG tablet Take 3.125 mg by mouth 2 (two) times daily with a meal.    [provider]  glipiZIDE (GLUCOTROL) 5 MG tablet Take 5 mg by mouth daily before breakfast.     [provider]  losartan (COZAAR) 50 MG tablet Take 50 mg by mouth daily.    [provider]  metFORMIN (GLUCOPHAGE) 1000 MG tablet Take 1,000 mg by mouth 2 (two) times daily with a meal.    [provider]  omeprazole (PRILOSEC) 20 MG capsule Take 20 mg by mouth daily.    [provider]  ondansetron (ZOFRAN ODT) 4 MG disintegrating tablet Take 1 tablet (4 mg total) by mouth every 8 (eight) hours as needed for nausea or vomiting. 05/03/19   Minna Antis, MD  ondansetron (ZOFRAN) 4 MG tablet Take 1 tablet (4 mg total) by mouth every 8 (eight) hours as needed for nausea or vomiting. 07/21/18   Houston Siren, MD  potassium chloride SA (K-DUR,KLOR-CON) 20 MEQ tablet Take 1 tablet (  20 mEq total) by mouth 2 (two) times daily for 5 days. 07/21/18 07/26/18  Houston Siren, MD  pravastatin (PRAVACHOL) 40 MG tablet Take 40 mg by mouth daily.    [provider]  rOPINIRole (REQUIP) 0.25 MG tablet Take 0.25 mg by mouth 3 (three) times daily.    [provider]  sitaGLIPtin (JANUVIA) 100 MG tablet Take 100 mg by mouth daily.    [provider]  triamterene-hydrochlorothiazide  (MAXZIDE-25) 37.5-25 MG tablet Take 1 tablet by mouth daily.    [provider]    Allergies Clarithromycin, Erythromycin ethylsuccinate, and Trandolapril  History reviewed. No pertinent family history.  Social History Social History   Tobacco Use   Smoking status: Never   Smokeless tobacco: Never  Vaping Use   Vaping Use: Never used  Substance Use Topics   Alcohol use: Not Currently   Drug use: Never     Review of Systems  Constitutional: No fever/chills Eyes: No visual changes. No discharge ENT: No upper respiratory complaints. Cardiovascular: no chest pain. Respiratory: no cough. No SOB. Gastrointestinal: No abdominal pain.  No nausea, no vomiting.  No diarrhea.  No constipation. Genitourinary: Negative for dysuria. No hematuria.  Positive for urinary retention Musculoskeletal: Negative for musculoskeletal pain. Skin: Negative for rash, abrasions, lacerations, ecchymosis. Neurological: Negative for headaches, focal weakness or numbness.  10 System ROS otherwise negative.  ____________________________________________   PHYSICAL EXAM:  VITAL SIGNS: ED Triage Vitals  Enc Vitals Group     BP 03/27/21 1510 (!) 152/75     Pulse Rate 03/27/21 1510 (!) 117     Resp 03/27/21 1510 18     Temp 03/27/21 1510 97.9 F (36.6 C)     Temp Source 03/27/21 1510 Oral     SpO2 03/27/21 1510 99 %     Weight 03/27/21 1511 118 lb (53.5 kg)     Height 03/27/21 1511 5\' 4"  (1.626 m)     Head Circumference --      Peak Flow --      Pain Score 03/27/21 1510 1     Pain Loc --      Pain Edu? --      Excl. in GC? --      Constitutional: Alert and oriented. Well appearing and in no acute distress. Eyes: Conjunctivae are normal. PERRL. EOMI. Head: Atraumatic. ENT:      Ears:       Nose: No congestion/rhinnorhea.      Mouth/Throat: Mucous membranes are moist.  Neck: No stridor.    Cardiovascular: Normal rate, regular rhythm. Normal S1 and S2.  Good peripheral  circulation. Respiratory: Normal respiratory effort without tachypnea or retractions. Lungs CTAB. Good air entry to the bases with no decreased or absent breath sounds. Gastrointestinal: Bowel sounds 4 quadrants. Soft and nontender to palpation. No guarding or rigidity. No palpable masses. No distention. No CVA tenderness. Musculoskeletal: Full range of motion to all extremities. No gross deformities appreciated. Neurologic:  Normal speech and language. No gross focal neurologic deficits are appreciated.  Skin:  Skin is warm, dry and intact. No rash noted. Psychiatric: Mood and affect are normal. Speech and behavior are normal. Patient exhibits appropriate insight and judgement.   ____________________________________________   LABS (all labs ordered are listed, but only abnormal results are displayed)  Labs Reviewed  CBC WITH DIFFERENTIAL/PLATELET - Abnormal; Notable for the following components:      Result Value   Hemoglobin 11.0 (*)    HCT 31.8 (*)  All other components within normal limits  COMPREHENSIVE METABOLIC PANEL - Abnormal; Notable for the following components:   Sodium 124 (*)    Potassium 3.4 (*)    Chloride 86 (*)    Glucose, Bld 108 (*)    Creatinine, Ser 1.23 (*)    GFR, Estimated 46 (*)    All other components within normal limits  URINALYSIS, COMPLETE (UACMP) WITH MICROSCOPIC - Abnormal; Notable for the following components:   Color, Urine YELLOW (*)    APPearance HAZY (*)    Hgb urine dipstick SMALL (*)    Ketones, ur 5 (*)    Leukocytes,Ua SMALL (*)    Bacteria, UA MANY (*)    All other components within normal limits  SARS CORONAVIRUS 2 (TAT 6-24 HRS)   ____________________________________________  EKG   ____________________________________________  RADIOLOGY I personally viewed and evaluated these images as part of my medical decision making, as well as reviewing the written report by the radiologist.  ED Provider Interpretation: Renal  ultrasound revealed no evidence of significant hydronephrosis.  US Renal  Result Date: 03/27/2021 CLINICAL DATA:  Urinary retention. EXAM: RENAL / URINARY TRACT ULTRASOUND COMPLETE COMPARISON:  October 25, 2013 FINDINGS: Right Kidney: Renal measurements: 10.9 cm x 5.5 cm x 5.0 cm = volume: 157.1 mL. Echogenicity within normal limits. No mass or hydronephrosis visualized. Left Kidney: Renal measurements: 8.2 cm x 4.1 cm x 3.6 cm = volume: 6.1 mL. Mild renal cortical thinning is noted. Echogenicity within normal limits. No mass or hydronephrosis visualized. Bladder: Appears normal for degree of bladder distention. The right and left ureteral jets are not visualized. Other: The study is limited secondary to patient motion. IMPRESSION: Mild left renal cortical thinning. Electronically Signed   By: Aram Candela M.D.   On: 03/27/2021 17:36    ____________________________________________    PROCEDURES  Procedure(s) performed:    Procedures    Medications  cefTRIAXone (ROCEPHIN) 1 g in sodium chloride 0.9 % 100 mL IVPB (has no administration in time range)  sodium chloride 0.9 % bolus 1,000 mL (has no administration in time range)     ____________________________________________   INITIAL IMPRESSION / ASSESSMENT AND PLAN / ED COURSE  Pertinent labs & imaging results that were available during my care of the patient were reviewed by me and considered in my medical decision making (see chart for details).  Review of the  [place] was performed in accordance of the [place] prior to dispensing any controlled drugs.           [name] diagnosis is consistent with hyponatremia, weakness, urinary retention.  Patient presents with her son for complaint of progressive weakness.  She was seen by her primary care yesterday, labs have been ordered that returned today which showed a sodium of 126.  This is further dropped to 124 here on arrival.  Patient states that everything feels "heavy" and she  feels weak.  She is also had several falls a couple weeks ago with a similar feeling of heaviness.  Patient has not reported a recent fall.  Patient was also concerned that she has had urinary retention starting at midnight.  She states that she has an urge to urinate but cannot urinate at this time.  Bladder scan only showed 112 mL of urine but patient states that she has had 0 urine production since midnight which is very atypical for her.  Patient wanted to try urinating again here in the emergency department versus having a catheter and still cannot urinate.  Patient will have Foley cath placed at this time.  Urinalysis revealed white blood cells and many bacteria.  Given the sudden onset retention, no history of same, no concerning neuro deficits, no recent surgical procedures and no evidence of pyelonephritis or hydronephrosis on ultrasound I feel that a UTI is likely the cause of patient's urinary retention.  Patient will have Rocephin, IV fluids for both UTI as well as hyponatremia.  Discussed the patient with the hospitalist who agrees to admit the patient at this time.       ____________________________________________  FINAL CLINICAL IMPRESSION(S) / ED DIAGNOSES  Final diagnoses:  Hyponatremia  Urinary retention  Weakness  Urinary tract infection without hematuria, site unspecified      NEW MEDICATIONS STARTED DURING THIS VISIT:  ED Discharge Orders     None           This chart was dictated using voice recognition software/Dragon. Despite best efforts to proofread, errors can occur which can change the meaning. Any change was purely unintentional.    Racheal Patches, PA-C 03/27/21 1903    Chesley Noon, MD 03/27/21 2246

## 2021-03-27 NOTE — ED Notes (Signed)
US at bedside

## 2021-03-27 NOTE — ED Notes (Signed)
Pt presents to ED with c/o of not being able to void since in the middle of the night. Pt states she feels the urge to go but cant. Pt states she had some intake. Pt denies urinary symptoms. Pt denies fever or chills. Pt is A&Ox4.

## 2021-03-28 ENCOUNTER — Other Ambulatory Visit: Payer: Self-pay

## 2021-03-28 LAB — GLUCOSE, CAPILLARY
Glucose-Capillary: 141 mg/dL — ABNORMAL HIGH (ref 70–99)
Glucose-Capillary: 165 mg/dL — ABNORMAL HIGH (ref 70–99)
Glucose-Capillary: 133 mg/dL — ABNORMAL HIGH (ref 70–99)
Glucose-Capillary: 134 mg/dL — ABNORMAL HIGH (ref 70–99)

## 2021-03-28 LAB — COMPREHENSIVE METABOLIC PANEL
ALT: 7 U/L (ref 0–44)
AST: 10 U/L — ABNORMAL LOW (ref 15–41)
Albumin: 3.2 g/dL — ABNORMAL LOW (ref 3.5–5.0)
Alkaline Phosphatase: 40 U/L (ref 38–126)
Anion gap: 10 (ref 5–15)
BUN: 17 mg/dL (ref 8–23)
CO2: 25 mmol/L (ref 22–32)
Calcium: 8.1 mg/dL — ABNORMAL LOW (ref 8.9–10.3)
Chloride: 95 mmol/L — ABNORMAL LOW (ref 98–111)
Creatinine, Ser: 0.93 mg/dL (ref 0.44–1.00)
GFR, Estimated: >60 mL/min (ref >60)
Glucose, Bld: 123 mg/dL — ABNORMAL HIGH (ref 70–99)
Potassium: 3 mmol/L — ABNORMAL LOW (ref 3.5–5.1)
Sodium: 130 mmol/L — ABNORMAL LOW (ref 135–145)
Total Bilirubin: 0.7 mg/dL (ref 0.3–1.2)
Total Protein: 5.5 g/dL — ABNORMAL LOW (ref 6.5–8.1)

## 2021-03-28 LAB — CBC
HCT: 29.1 % — ABNORMAL LOW (ref 36.0–46.0)
Hemoglobin: 9.9 g/dL — ABNORMAL LOW (ref 12.0–15.0)
MCH: 27.4 pg (ref 26.0–34.0)
MCHC: 34 g/dL (ref 30.0–36.0)
MCV: 80.6 fL (ref 80.0–100.0)
Platelets: 277 10*3/uL (ref 150–400)
RBC: 3.61 MIL/uL — ABNORMAL LOW (ref 3.87–5.11)
RDW: 13.9 % (ref 11.5–15.5)
WBC: 6.4 10*3/uL (ref 4.0–10.5)
nRBC: 0 % (ref 0.0–0.2)

## 2021-03-28 LAB — SARS CORONAVIRUS 2 (TAT 6-24 HRS): SARS Coronavirus 2: NEGATIVE

## 2021-03-28 MED ORDER — LOSARTAN POTASSIUM 25 MG PO TABS
25.0000 mg | ORAL_TABLET | Freq: Every day | ORAL | Status: DC
  Administered 2021-03-28 – 2021-03-29 (×2): 25 mg via ORAL
  Filled 2021-03-28 (×2): qty 1

## 2021-03-28 MED ORDER — CARVEDILOL 6.25 MG PO TABS
3.1250 mg | ORAL_TABLET | Freq: Two times a day (BID) | ORAL | Status: DC
  Administered 2021-03-28 – 2021-03-29 (×3): 3.125 mg via ORAL
  Filled 2021-03-28 (×3): qty 1

## 2021-03-28 MED ORDER — CYANOCOBALAMIN 1000 MCG/ML IJ SOLN
1000.0000 ug | INTRAMUSCULAR | Status: DC
  Administered 2021-03-28: 1000 ug via INTRAMUSCULAR
  Filled 2021-03-28: qty 1

## 2021-03-28 MED ORDER — ROPINIROLE HCL 0.25 MG PO TABS
0.2500 mg | ORAL_TABLET | Freq: Three times a day (TID) | ORAL | Status: DC
  Administered 2021-03-28 – 2021-03-29 (×4): 0.25 mg via ORAL
  Filled 2021-03-28 (×6): qty 1

## 2021-03-28 MED ORDER — GLIPIZIDE 5 MG PO TABS
5.0000 mg | ORAL_TABLET | Freq: Every day | ORAL | Status: DC
  Administered 2021-03-28: 5 mg via ORAL
  Filled 2021-03-28: qty 1

## 2021-03-28 MED ORDER — ADULT MULTIVITAMIN W/MINERALS CH
1.0000 | ORAL_TABLET | Freq: Every day | ORAL | Status: DC
  Administered 2021-03-28 – 2021-03-29 (×2): 1 via ORAL
  Filled 2021-03-28 (×2): qty 1

## 2021-03-28 MED ORDER — METHOCARBAMOL 500 MG PO TABS
250.0000 mg | ORAL_TABLET | Freq: Once | ORAL | Status: AC
  Administered 2021-03-28: 250 mg via ORAL
  Filled 2021-03-28: qty 1

## 2021-03-28 MED ORDER — PANTOPRAZOLE SODIUM 40 MG PO TBEC
40.0000 mg | DELAYED_RELEASE_TABLET | Freq: Every day | ORAL | Status: DC
  Administered 2021-03-28 – 2021-03-29 (×2): 40 mg via ORAL
  Filled 2021-03-28 (×2): qty 1

## 2021-03-28 MED ORDER — CARBIDOPA-LEVODOPA 25-250 MG PO TABS
1.0000 | ORAL_TABLET | Freq: Three times a day (TID) | ORAL | Status: DC
  Administered 2021-03-28 – 2021-03-29 (×4): 1 via ORAL
  Filled 2021-03-28 (×6): qty 1

## 2021-03-28 MED ORDER — LINAGLIPTIN 5 MG PO TABS
5.0000 mg | ORAL_TABLET | Freq: Every day | ORAL | Status: DC
  Administered 2021-03-28 – 2021-03-29 (×2): 5 mg via ORAL
  Filled 2021-03-28 (×2): qty 1

## 2021-03-28 MED ORDER — POTASSIUM CHLORIDE CRYS ER 20 MEQ PO TBCR
40.0000 meq | EXTENDED_RELEASE_TABLET | ORAL | Status: AC
  Administered 2021-03-28 (×2): 40 meq via ORAL
  Filled 2021-03-28 (×2): qty 2

## 2021-03-28 MED ORDER — PRAVASTATIN SODIUM 20 MG PO TABS
40.0000 mg | ORAL_TABLET | Freq: Every day | ORAL | Status: DC
  Administered 2021-03-28: 40 mg via ORAL
  Filled 2021-03-28: qty 2

## 2021-03-28 MED ORDER — OXYMETAZOLINE HCL 0.05 % NA SOLN
1.0000 | Freq: Two times a day (BID) | NASAL | Status: DC | PRN
  Administered 2021-03-28: 1 via NASAL
  Filled 2021-03-28: qty 15

## 2021-03-28 NOTE — Plan of Care (Signed)

## 2021-03-28 NOTE — Progress Notes (Signed)
Initial Nutrition Assessment  DOCUMENTATION CODES:   Not applicable  INTERVENTION:   -MVI with minerals daily -Magic cup TID with meals, each supplement provides 290 kcal and 9 grams of protein  -Liberalize diet to regular for widest variety of food selections -Feeding assistance with meals  NUTRITION DIAGNOSIS:   Predicted suboptimal nutrient intake related to chronic illness (parkinson's) as evidenced by estimated needs.  GOAL:   Patient will meet greater than or equal to 90% of their needs  MONITOR:   PO intake, Supplement acceptance, Labs, Weight trends, Skin, I & O's  REASON FOR ASSESSMENT:   Malnutrition Screening Tool    ASSESSMENT:   Lorraine Wyatt is a 75 y.o. female with medical history significant of diabetes, hypertension, hyperlipidemia, Parkinson's disease on treatment who presents to the ER originally complaining of urinary retention.  Symptoms apparently started around midnight.  She had ST read but could not go.  Patient had no urinary output.  She came into the ER complaining of dribbling then.  She was seen and evaluated.  Patient was bladder scanned and had only 100 cc in height bladder.  No bladder spasms.  No evidence of hydronephrosis or any evidence of outlet obstruction. Patient reported several falls over the last 1 month.  She was also seen by PCP yesterday at which point her sodium was 126.  In the ER today work-up done showed sodium of 124.  Patient is therefore being admitted to the hospital with complaint of generalized weakness, urinary retention but significant hyponatremia.  Pt admitted with hyponatremia.   Reviewed I/O's: -2 L x 24 hours  UOP: 2.7 L x 24 hours  Pt unavailable at time of visit. Attempted to speak with pt via call to hospital room phone, however, unable to reach. RD unable to obtain further nutrition-related history or complete nutrition-focused physical exam at this time.    Per MD notes, pt has had recurrent  falls.  Reviewed wt hx; noted distant history of weight loss.   Medications reviewed and include sinemet, vitamin B-12,and 0.9% sodium chloride infusion @ 100 ml/hr.   No results found for: HGBA1C PTA DM medications are 5 mg glipizide daily, 100 mg januvia daily, and 1000 mg metformin BID.   Labs reviewed: Na: 130, K: 3.0, CBGS: 136-165 (inpatient orders for glycemic control are 0-5 units insulin aspart daily at bedtime, 0-9 units insulin aspart TID with meals, and 5 mg linagliptin daily).    Diet Order:   Diet Order             Diet Carb Modified Fluid consistency: Thin; Room service appropriate? Yes  Diet effective now                   EDUCATION NEEDS:   No education needs have been identified at this time  Skin:  Skin Assessment: Reviewed RN Assessment  Last BM:  03/23/21  Height:   Ht Readings from Last 1 Encounters:  03/27/21 5\' 4"  (1.626 m)    Weight:   Wt Readings from Last 1 Encounters:  03/27/21 53.5 kg    Ideal Body Weight:  54.5 kg  BMI:  Body mass index is 20.25 kg/m.  Estimated Nutritional Needs:   Kcal:  1850-2050  Protein:  105-120 grams  Fluid:  > 1.8 L    03/29/21, RD, LDN, CDCES Registered Dietitian II Certified Diabetes Care and Education Specialist Please refer to [facility] for RD and/or RD on-call/weekend/after hours pager

## 2021-03-28 NOTE — Progress Notes (Signed)
PROGRESS NOTE    Lorraine Wyatt  ASN:[id number] DOB: 1946/03/22 DOA: 03/27/2021 PCP: Jerl Mina, MD   Brief Narrative:   75 y.o. female with medical history significant of diabetes, hypertension, hyperlipidemia, Parkinson's disease on treatment who presents to the ER originally complaining of urinary retention.  Symptoms apparently started around midnight.  She had ST read but could not go.  Patient had no urinary output.  She came into the ER complaining of dribbling then.  She was seen and evaluated.  Patient was bladder scanned and had only 100 cc in height bladder.  No bladder spasms.  No evidence of hydronephrosis or any evidence of outlet obstruction. Patient reported several falls over the last 1 month.  She was also seen by PCP yesterday at which point her sodium was 126.  In the ER today work-up done showed sodium of 124.  Patient is therefore being admitted to the hospital with complaint of generalized weakness, urinary retention but significant hyponatremia.   Assessment & Plan:   Principal Problem:   Hyponatremia Active Problems:   TIA (transient ischemic attack)   Acute lower UTI   Essential hypertension   Mixed hyperlipidemia   Parkinson's disease (HCC)   Type 2 diabetes mellitus (HCC)   Urinary incontinence in female  Symptomatic hypovolemic hyponatremia Secondary to volume depletion in setting of Parkinson's disease and ongoing diuresis Serum sodium improved after initiation of maintenance fluids Plan: Continue maintenance fluids for today Recheck sodium in a.m. Request therapy evaluations  Urinary retention Possible urinary tract infection Urinalysis inconclusive for infection Patient unable to urinate and Foley placed in ED Plan: Continue IV Rocephin for now Limit antibiotics to 3 days Voiding trial today, DC Foley catheter.  Bladder scans every 6 hours.  Straight cath as needed bladder volume greater than 350 cc  Hypokalemia Monitor and replace as  necessary  Essential hypertension Home diuretics on hold  Parkinson's disease Continue PTA carbidopa levodopa Continue PTA Cogentin  Hyperlipidemia PTA statin  Diabetes mellitus type 2 Home regimen of glipizide, metformin, Januvia Januvia resumed Sliding-scale insulin Other agents on hold    DVT prophylaxis: SQ Lovenox Code Status: Full Family Communication: Son at bedside 9/24 Disposition Plan:Status is: Inpatient  Remains inpatient appropriate because:Inpatient level of care appropriate due to severity of illness  Dispo: The patient is from: Home              Anticipated d/c is to: Home              Patient currently is not medically stable to d/c.   Difficult to place patient No       Level of care: Med-Surg  Consultants:  None  Procedures:  None  Antimicrobials:  Ceftriaxone   Subjective: Seen and examined.  Reports improvement in symptoms since admission and initiation of IV fluids.  No pain complaints.  Objective: Vitals:   03/27/21 2046 03/27/21 2122 03/28/21 0339 03/28/21 0754  BP: (!) 163/86  (!) 164/80 (!) 165/89  Pulse: (!) 104  100 99  Resp: (!) 22 20 17 18   Temp:   (!) 97.5 F (36.4 C) 98 F (36.7 C)  TempSrc:   Oral Oral  SpO2: 99%  98% 97%  Weight:      Height:        Intake/Output Summary (Last 24 hours) at 03/28/2021 1125 Last data filed at 03/28/2021 1045 Gross per 24 hour  Intake 738.64 ml  Output 3125 ml  Net -2386.36 ml   03/30/2021  03/27/21 1511  Weight: 53.5 kg    Examination:  General exam: No acute distress.  Resting tremor secondary to Parkinson's Respiratory system: Clear to auscultation. Respiratory effort normal. Cardiovascular system: S1-S2, RRR, no murmurs Gastrointestinal system: Scaphoid, nontender, nondistended, normal bowel sounds Central nervous system: Alert and oriented.  Resting tremor secondary to Parkinson's Extremities: Decreased bilaterally Skin: No rashes, lesions or  ulcers Psychiatry: Judgement and insight appear normal. Mood & affect appropriate.     Data Reviewed: I have personally reviewed following labs and imaging studies  CBC: Recent Labs  Lab 03/27/21 1519 03/27/21 2056 03/28/21 0510  WBC 7.9 8.3 6.4  NEUTROABS 5.3  --   --   HGB 11.0* 10.6* 9.9*  HCT 31.8* 31.1* 29.1*  MCV 82.2 81.0 80.6  PLT 329 292 277   Basic Metabolic Panel: Recent Labs  Lab 03/27/21 1519 03/27/21 2056 03/28/21 0510  NA 124*  --  130*  K 3.4*  --  3.0*  CL 86*  --  95*  CO2 25  --  25  GLUCOSE 108*  --  123*  BUN 23  --  17  CREATININE 1.23* 1.02* 0.93  CALCIUM 9.1  --  8.1*   GFR: Estimated Creatinine Clearance: 44.8 mL/min (by C-G formula based on SCr of 0.93 mg/dL). Liver Function Tests: Recent Labs  Lab 03/27/21 1519 03/28/21 0510  AST 17 10*  ALT <5 7  ALKPHOS 53 40  BILITOT 0.7 0.7  PROT 7.0 5.5*  ALBUMIN 4.2 3.2*   No results for input(s): LIPASE, AMYLASE in the last 168 hours. No results for input(s): AMMONIA in the last 168 hours. Coagulation Profile: No results for input(s): INR, PROTIME in the last 168 hours. Cardiac Enzymes: No results for input(s): CKTOTAL, CKMB, CKMBINDEX, TROPONINI in the last 168 hours. BNP (last 3 results) No results for input(s): PROBNP in the last 8760 hours. HbA1C: No results for input(s): HGBA1C in the last 72 hours. CBG: Recent Labs  Lab 03/27/21 2124 03/28/21 0757  GLUCAP 136* 141*   Lipid Profile: No results for input(s): CHOL, HDL, LDLCALC, TRIG, CHOLHDL, LDLDIRECT in the last 72 hours. Thyroid Function Tests: No results for input(s): TSH, T4TOTAL, FREET4, T3FREE, THYROIDAB in the last 72 hours. Anemia Panel: No results for input(s): VITAMINB12, FOLATE, FERRITIN, TIBC, IRON, RETICCTPCT in the last 72 hours. Sepsis Labs: No results for input(s): PROCALCITON, LATICACIDVEN in the last 168 hours.  Recent Results (from the past 240 hour(s))  SARS CORONAVIRUS 2 (TAT 6-24 HRS)  Nasopharyngeal Nasopharyngeal Swab     Status: None   Collection Time: 03/27/21  7:16 PM   Specimen: Nasopharyngeal Swab  Result Value Ref Range Status   SARS Coronavirus 2 NEGATIVE NEGATIVE Final    Comment: (NOTE) SARS-CoV-2 target nucleic acids are NOT DETECTED.  The SARS-CoV-2 RNA is generally detectable in upper and lower respiratory specimens during the acute phase of infection. Negative results do not preclude SARS-CoV-2 infection, do not rule out co-infections with other pathogens, and should not be used as the sole basis for treatment or other patient management decisions. Negative results must be combined with clinical observations, patient history, and epidemiological information. The expected result is Negative.  Fact Sheet for Patients: HairSlick.no  Fact Sheet for Healthcare Providers: quierodirigir.com  This test is not yet approved or cleared by the [place] FDA and  has been authorized for detection and/or diagnosis of SARS-CoV-2 by FDA under an Emergency Use Authorization (EUA). This EUA will remain  in effect (meaning this  test can be used) for the duration of the COVID-19 declaration under Se ction 564(b)(1) of the Act, 21 U.S.C. section 360bbb-3(b)(1), unless the authorization is terminated or revoked sooner.  Performed at [facility] [place] [facility], [address] [place], [place] [address]          Radiology Studies: US Renal  Result Date: 03/27/2021 CLINICAL DATA:  Urinary retention. EXAM: RENAL / URINARY TRACT ULTRASOUND COMPLETE COMPARISON:  October 25, 2013 FINDINGS: Right Kidney: Renal measurements: 10.9 cm x 5.5 cm x 5.0 cm = volume: 157.1 mL. Echogenicity within normal limits. No mass or hydronephrosis visualized. Left Kidney: Renal measurements: 8.2 cm x 4.1 cm x 3.6 cm = volume: 6.1 mL. Mild renal cortical thinning is noted. Echogenicity within normal limits. No mass or hydronephrosis  visualized. Bladder: Appears normal for degree of bladder distention. The right and left ureteral jets are not visualized. Other: The study is limited secondary to patient motion. IMPRESSION: Mild left renal cortical thinning. Electronically Signed   By: Aram Candela M.D.   On: 03/27/2021 17:36        Scheduled Meds:  carbidopa-levodopa  1 tablet Oral TID   carvedilol  3.125 mg Oral BID WC   cyanocobalamin  1,000 mcg Intramuscular Q30 days   enoxaparin (LOVENOX) injection  40 mg Subcutaneous QHS   insulin aspart  0-5 Units Subcutaneous QHS   insulin aspart  0-9 Units Subcutaneous TID WC   linagliptin  5 mg Oral Daily   losartan  25 mg Oral Daily   pantoprazole  40 mg Oral Daily   potassium chloride  40 mEq Oral Q2H   pravastatin  40 mg Oral q1800   rOPINIRole  0.25 mg Oral TID   Continuous Infusions:  sodium chloride 100 mL/hr at 03/28/21 8546   cefTRIAXone (ROCEPHIN)  IV       LOS: 1 day    Time spent: 35 minutes    Tresa Moore, MD [facility] Pager [phone]  If 7PM-7AM, please contact night-coverage 03/28/2021, 11:25 AM

## 2021-03-28 NOTE — Evaluation (Signed)
Occupational Therapy Evaluation Patient Details Name: Lorraine Wyatt MRN: [id number] DOB: 01-30-1946 Today's Date: 03/28/2021   History of Present Illness Patient admitted to the hospital with complaint of generalized weakness, urinary retention but significant hyponatremia; PMH includes diabetes, hypertension, hyperlipidemia, Parkinson's disease   Clinical Impression   Chart reviewed, RN cleared pt for participation in OT evaluation. Pt is greeted in chair, agreeable to evaluation. Pt reports she "feels weaker" than PTA. Pt reports she lives alone in a one story house, family is available to assist as needed. Per patient report, house appears to have appropriate accessible set up for pts current needs. Pt performs STS with SUPERVISION, functional ambulation to bathroom with RW with CGA, toileting with supervision. Kyphotic posturing noted throughout with pt correcting with vcs. Pt performs grooming at sink level with supervision, UB dressing with MIN A. Pt appears to be performing below PLOF, would benefit from ongoing skilled inpatient OT to address functional deficits,facilitate a safe dishcarge. Pt would also benefit from [facility], family assistance. Pt is left in bed, NAD, all needs met. Son educated on recommendations, all are in agreement. RN is aware of pt status. OT will continue to follow while admitted.      Recommendations for follow up therapy are one component of a multi-disciplinary discharge planning process, led by the attending physician.  Recommendations may be updated based on patient status, additional functional criteria and insurance authorization.   Follow Up Recommendations  Home health OT;Supervision - Intermittent    Equipment Recommendations  None recommended by OT;Other (comment) (pt has recommended equipment at home)       Precautions / Restrictions Precautions Precautions: Fall Restrictions Weight Bearing Restrictions: No      Mobility Bed Mobility Overal bed  mobility: Needs Assistance Bed Mobility: Sit to Supine;Supine to Sit     Supine to sit: Min assist Sit to supine: Min assist   General bed mobility comments: MIN A required for managment of BLE due to pt reported stiffness    Transfers Overall transfer level: Needs assistance Equipment used: Rolling walker (2 wheeled) Transfers: Sit to/from [facility] Sit to Stand: Supervision Stand pivot transfers: Min guard            Balance Overall balance assessment: Needs assistance Sitting-balance support: Feet supported Sitting balance-Leahy Scale: Good     Standing balance support: Bilateral upper extremity supported;During functional activity Standing balance-Leahy Scale: Fair                             ADL either performed or assessed with clinical judgement   ADL Overall ADL's : Needs assistance/impaired Eating/Feeding: Modified independent   Grooming: Wash/dry hands;Standing;Supervision/safety Grooming Details (indicate cue type and reason): at sink         Upper Body Dressing : Minimal assistance;Sitting Upper Body Dressing Details (indicate cue type and reason): doff shirt, donn gown     Toilet Transfer: Min guard;BSC   Toileting- Clothing Manipulation and Hygiene: Min guard       Functional mobility during ADLs: Min guard;Rolling walker       Pertinent Vitals/Pain Pain Assessment: 0-10 Pain Score: 0-No pain     Hand Dominance     Extremity/Trunk Assessment Upper Extremity Assessment Upper Extremity Assessment: Overall WFL for tasks assessed   Lower Extremity Assessment Lower Extremity Assessment: Generalized weakness   Cervical / Trunk Assessment Cervical / Trunk Assessment: Kyphotic (vcs required for upright positioning)   Communication Communication Communication:  No difficulties   Cognition Arousal/Alertness: Awake/alert Behavior During Therapy: WFL for tasks assessed/performed Overall Cognitive Status:  Within Functional Limits for tasks assessed                                 General Comments: Alert and oriented x4   General Comments               Home Living Family/patient expects to be discharged to:: Private residence Living Arrangements: Alone Available Help at Discharge: Available PRN/intermittently;Family Type of Home: House       Home Layout: One level           Bathroom Accessibility: Yes   Home Equipment: Shower seat;Hand held shower head;Grab bars - tub/shower;Grab bars - toilet          Prior Functioning/Environment Level of Independence: Independent with assistive device(s)        Comments: Assist with IADLs from grandaugther; MOD I- indepedent in ADL per pt report        OT Problem List: Decreased strength;Decreased activity tolerance;Impaired balance (sitting and/or standing)      OT Treatment/Interventions: Self-care/ADL training;Therapeutic exercise;Patient/family education;Balance training;Energy conservation;DME and/or AE instruction;Therapeutic activities    OT Goals(Current goals can be found in the care plan section) Acute Rehab OT Goals Patient Stated Goal: to go home OT Goal Formulation: With patient Time For Goal Achievement: 04/11/21 Potential to Achieve Goals: Good ADL Goals Pt Will Perform Grooming: with modified independence;standing Pt Will Perform Upper Body Dressing: with modified independence;sitting Pt Will Perform Lower Body Dressing: with modified independence;sitting/lateral leans Pt Will Perform Toileting - Clothing Manipulation and hygiene: with modified independence;sit to/from stand  OT Frequency: Min 2X/week    AM-PAC OT "6 Clicks" Daily Activity     Outcome Measure Help from another person eating meals?: None Help from another person taking care of personal grooming?: A Little Help from another person toileting, which includes using toliet, bedpan, or urinal?: A Little Help from another person  bathing (including washing, rinsing, drying)?: A Little Help from another person to put on and taking off regular upper body clothing?: A Little Help from another person to put on and taking off regular lower body clothing?: A Little 6 Click Score: 19   End of Session Equipment Utilized During Treatment: Gait belt;Rolling walker Nurse Communication: Mobility status  Activity Tolerance: Patient tolerated treatment well Patient left: in bed;with call bell/phone within reach;with chair alarm set  OT Visit Diagnosis: Unsteadiness on feet (R26.81);Other abnormalities of gait and mobility (R26.89);Muscle weakness (generalized) (M62.81)                Time: 1610-9604 OT Time Calculation (min): 19 min Charges:  OT General Charges $OT Visit: 1 Visit OT Evaluation $OT Eval Low Complexity: 1 Low OT Treatments $Self Care/Home Management : 8-22 mins  Shanon Payor, OTD OTR/L  03/28/21, 2:14 PM

## 2021-03-28 NOTE — Progress Notes (Signed)
PT Cancellation Note  Patient Details Name: Lorraine Wyatt MRN: [id number] DOB: 20-May-1946   Cancelled Treatment:    Reason Eval/Treat Not Completed: Fatigue/lethargy limiting ability to participate. Pt supine sleeping upon entry. When PT introduced herself and reason for visit, pt states "no." When questioned why, she states that she already worked with PT. PT educated pt that she was evaluated by OT. Pt continued to state "no." PT offered to come back later, with pt responding "I don't care what you do." Will follow up with therapy evaluation tomorrow, as appropriate.  Vira Blanco, PT, DPT 2:42 PM,03/28/21

## 2021-03-29 LAB — CBC WITH DIFFERENTIAL/PLATELET
Abs Immature Granulocytes: 0.04 10*3/uL (ref 0.00–0.07)
Basophils Absolute: 0 10*3/uL (ref 0.0–0.1)
Basophils Relative: 1 %
Eosinophils Absolute: 0.1 10*3/uL (ref 0.0–0.5)
Eosinophils Relative: 1 %
HCT: 34.1 % — ABNORMAL LOW (ref 36.0–46.0)
Hemoglobin: 11.6 g/dL — ABNORMAL LOW (ref 12.0–15.0)
Immature Granulocytes: 1 %
Lymphocytes Relative: 20 %
Lymphs Abs: 1.4 10*3/uL (ref 0.7–4.0)
MCH: 27.8 pg (ref 26.0–34.0)
MCHC: 34 g/dL (ref 30.0–36.0)
MCV: 81.8 fL (ref 80.0–100.0)
Monocytes Absolute: 0.5 10*3/uL (ref 0.1–1.0)
Monocytes Relative: 7 %
Neutro Abs: 5 10*3/uL (ref 1.7–7.7)
Neutrophils Relative %: 70 %
Platelets: 333 10*3/uL (ref 150–400)
RBC: 4.17 MIL/uL (ref 3.87–5.11)
RDW: 14.2 % (ref 11.5–15.5)
WBC: 7.1 10*3/uL (ref 4.0–10.5)
nRBC: 0 % (ref 0.0–0.2)

## 2021-03-29 LAB — BASIC METABOLIC PANEL
Anion gap: 13 (ref 5–15)
BUN: 13 mg/dL (ref 8–23)
CO2: 22 mmol/L (ref 22–32)
Calcium: 9.1 mg/dL (ref 8.9–10.3)
Chloride: 94 mmol/L — ABNORMAL LOW (ref 98–111)
Creatinine, Ser: 0.95 mg/dL (ref 0.44–1.00)
GFR, Estimated: >60 mL/min (ref >60)
Glucose, Bld: 148 mg/dL — ABNORMAL HIGH (ref 70–99)
Potassium: 4 mmol/L (ref 3.5–5.1)
Sodium: 129 mmol/L — ABNORMAL LOW (ref 135–145)

## 2021-03-29 LAB — GLUCOSE, CAPILLARY: Glucose-Capillary: 149 mg/dL — ABNORMAL HIGH (ref 70–99)

## 2021-03-29 MED ORDER — SODIUM CHLORIDE 0.9 % IV SOLN
1.0000 g | INTRAVENOUS | Status: DC
  Filled 2021-03-29: qty 10

## 2021-03-29 MED ORDER — CIPROFLOXACIN HCL 500 MG PO TABS: 500.0000 mg | ORAL_TABLET | Freq: Two times a day (BID) | ORAL | 0 refills | Status: AC

## 2021-03-29 NOTE — Discharge Summary (Signed)
Physician Discharge Summary  Lorraine Wyatt GHW:[id number] DOB: July 23, 1945 DOA: 03/27/2021  PCP: Jerl Mina, MD  Admit date: 03/27/2021 Discharge date: 03/29/2021  Admitted From: Home Disposition: Home with home health  Recommendations for Outpatient Follow-up:  Follow up with PCP in 1-2 weeks   Home Health: Yes, PT/OT Equipment/Devices: None  Discharge Condition: Stable CODE STATUS: Full Diet recommendation: Regular  Brief/Interim Summary:  75 y.o. female with medical history significant of diabetes, hypertension, hyperlipidemia, Parkinson's disease on treatment who presents to the ER originally complaining of urinary retention.  Symptoms apparently started around midnight.  She had ST read but could not go.  Patient had no urinary output.  She came into the ER complaining of dribbling then.  She was seen and evaluated.  Patient was bladder scanned and had only 100 cc in height bladder.  No bladder spasms.  No evidence of hydronephrosis or any evidence of outlet obstruction. Patient reported several falls over the last 1 month.  She was also seen by PCP yesterday at which point her sodium was 126.  In the ER today work-up done showed sodium of 124.  Patient is therefore being admitted to the hospital with complaint of generalized weakness, urinary retention but significant hyponatremia.   Patient was placed on supplemental IV fluids and serum sodium levels did improve.  Energy level also improved.  Treated empirically for presumed urinary tract infection.  Unclear etiology of urinary retention.  Foley catheter discontinued and patient has been able to void spontaneously without issue.  We will treat empirically for 3days for presumed urinary tract infection.  Serum sodium 129 on discharge.  I suspect a chronic element here.  Patient should follow-up with her PCP within 1 week of discharge to discuss ongoing medication regimen and possible etiologies for hyponatremia.  Encourage adequate p.o.  fluid intake on discharge.   Discharge Diagnoses:  Principal Problem:   Hyponatremia Active Problems:   TIA (transient ischemic attack)   Acute lower UTI   Essential hypertension   Mixed hyperlipidemia   Parkinson's disease (HCC)   Type 2 diabetes mellitus (HCC)   Urinary incontinence in female  Symptomatic hypovolemic hyponatremia Secondary to volume depletion in setting of Parkinson's disease and ongoing diuresis.  Diuretics held while inpatient and serum sodium did improve.  Patient was on intravenous fluids throughout course of admission.  Sodium stable at time of discharge.  Therapy evaluations requested and recommended home health PT OT.  These been ordered and patient can discharge home in stable condition.  Follow-up with PCP in 1 week.   Urinary retention Possible urinary tract infection Urinalysis inconclusive for infection Patient unable to urinate and Foley placed in ED Foley catheter discontinued and patient able to void spontaneously.  Unclear nature of possible urinary retention.  Will treat empirically for UTI with 3 days of p.o. antibiotic.   Discharge Instructions  Discharge Instructions     Diet - low sodium heart healthy   Complete by: As directed    Increase activity slowly   Complete by: As directed       Allergies as of 03/29/2021       Reactions   Clarithromycin    Erythromycin Ethylsuccinate    Trandolapril         Medication List     STOP taking these medications    benztropine 1 MG tablet Commonly known as: COGENTIN   ondansetron 4 MG disintegrating tablet Commonly known as: Zofran ODT   ondansetron 4 MG tablet Commonly known as:  ZOFRAN   potassium chloride SA 20 MEQ tablet Commonly known as: KLOR-CON   triamterene-hydrochlorothiazide 37.5-25 MG tablet Commonly known as: MAXZIDE-25       TAKE these medications    carbidopa-levodopa 25-250 MG tablet Commonly known as: SINEMET IR Take 1 tablet by mouth 3 (three) times  daily.   carvedilol 3.125 MG tablet Commonly known as: COREG Take 3.125 mg by mouth 2 (two) times daily with a meal.   ciprofloxacin 500 MG tablet Commonly known as: Cipro Take 1 tablet (500 mg total) by mouth 2 (two) times daily for 2 days.   cyanocobalamin 1000 MCG/ML injection Commonly known as: (VITAMIN B-12) Inject 1 mL into the muscle every 30 (thirty) days.   glipiZIDE 5 MG tablet Commonly known as: GLUCOTROL Take 5 mg by mouth daily before breakfast.   losartan 25 MG tablet Commonly known as: COZAAR Take 25 mg by mouth daily. What changed: Another medication with the same name was removed. Continue taking this medication, and follow the directions you see here.   metFORMIN 1000 MG tablet Commonly known as: GLUCOPHAGE Take 1,000 mg by mouth 2 (two) times daily with a meal.   omeprazole 20 MG capsule Commonly known as: PRILOSEC Take 20 mg by mouth daily.   pravastatin 40 MG tablet Commonly known as: PRAVACHOL Take 40 mg by mouth daily.   rOPINIRole 0.25 MG tablet Commonly known as: REQUIP Take 0.25 mg by mouth 3 (three) times daily.   sitaGLIPtin 100 MG tablet Commonly known as: JANUVIA Take 100 mg by mouth daily.        Allergies  Allergen Reactions   Clarithromycin    Erythromycin Ethylsuccinate    Trandolapril     Consultations: None   Procedures/Studies: US Renal  Result Date: 03/27/2021 CLINICAL DATA:  Urinary retention. EXAM: RENAL / URINARY TRACT ULTRASOUND COMPLETE COMPARISON:  October 25, 2013 FINDINGS: Right Kidney: Renal measurements: 10.9 cm x 5.5 cm x 5.0 cm = volume: 157.1 mL. Echogenicity within normal limits. No mass or hydronephrosis visualized. Left Kidney: Renal measurements: 8.2 cm x 4.1 cm x 3.6 cm = volume: 6.1 mL. Mild renal cortical thinning is noted. Echogenicity within normal limits. No mass or hydronephrosis visualized. Bladder: Appears normal for degree of bladder distention. The right and left ureteral jets are not  visualized. Other: The study is limited secondary to patient motion. IMPRESSION: Mild left renal cortical thinning. Electronically Signed   By: Aram Candela M.D.   On: 03/27/2021 17:36   (Echo, Carotid, EGD, Colonoscopy, ERCP)    Subjective: Seen and examined on the day of discharge.  Son at bedside.  Stable no distress.  Per nursing patient was apparently a little confused the previous night and had discontinued her IV line.  [name] mental status appears to be at baseline at time of discharge.  Discharge Exam: Vitals:   03/29/21 0400 03/29/21 0751  BP: (!) 159/84 (!) 168/84  Pulse: 92 89  Resp: 20 18  Temp: 98.7 F (37.1 C) 97.7 F (36.5 C)  SpO2: 98% 100%   Vitals:   03/28/21 1556 03/28/21 1942 03/29/21 0400 03/29/21 0751  BP: (!) 145/70 (!) 155/107 (!) 159/84 (!) 168/84  Pulse: 80 75 92 89  Resp: 18 20 20 18   Temp: 98 F (36.7 C) 98.6 F (37 C) 98.7 F (37.1 C) 97.7 F (36.5 C)  TempSrc: Oral Oral Oral Oral  SpO2: 100% 98% 98% 100%  Weight:      Height:  General: Pt is alert, awake, not in acute distress Cardiovascular: RRR, S1/S2 +, no rubs, no gallops Respiratory: CTA bilaterally, no wheezing, no rhonchi Abdominal: Soft, NT, ND, bowel sounds + Extremities: no edema, no cyanosis    The results of significant diagnostics from this hospitalization (including imaging, microbiology, ancillary and laboratory) are listed below for reference.     Microbiology: Recent Results (from the past 240 hour(s))  SARS CORONAVIRUS 2 (TAT 6-24 HRS) Nasopharyngeal Nasopharyngeal Swab     Status: None   Collection Time: 03/27/21  7:16 PM   Specimen: Nasopharyngeal Swab  Result Value Ref Range Status   SARS Coronavirus 2 NEGATIVE NEGATIVE Final    Comment: (NOTE) SARS-CoV-2 target nucleic acids are NOT DETECTED.  The SARS-CoV-2 RNA is generally detectable in upper and lower respiratory specimens during the acute phase of infection. Negative results do not preclude  SARS-CoV-2 infection, do not rule out co-infections with other pathogens, and should not be used as the sole basis for treatment or other patient management decisions. Negative results must be combined with clinical observations, patient history, and epidemiological information. The expected result is Negative.  Fact Sheet for Patients: HairSlick.no  Fact Sheet for Healthcare Providers: quierodirigir.com  This test is not yet approved or cleared by the [place] FDA and  has been authorized for detection and/or diagnosis of SARS-CoV-2 by FDA under an Emergency Use Authorization (EUA). This EUA will remain  in effect (meaning this test can be used) for the duration of the COVID-19 declaration under Se ction 564(b)(1) of the Act, 21 U.S.C. section 360bbb-3(b)(1), unless the authorization is terminated or revoked sooner.  Performed at [facility], [address] [place], [place] [address]      Labs: BNP (last 3 results) No results for input(s): BNP in the last 8760 hours. Basic Metabolic Panel: Recent Labs  Lab 03/27/21 1519 03/27/21 2056 03/28/21 0510 03/29/21 0808  NA 124*  --  130* 129*  K 3.4*  --  3.0* 4.0  CL 86*  --  95* 94*  CO2 25  --  25 22  GLUCOSE 108*  --  123* 148*  BUN 23  --  17 13  CREATININE 1.23* 1.02* 0.93 0.95  CALCIUM 9.1  --  8.1* 9.1   Liver Function Tests: Recent Labs  Lab 03/27/21 1519 03/28/21 0510  AST 17 10*  ALT <5 7  ALKPHOS 53 40  BILITOT 0.7 0.7  PROT 7.0 5.5*  ALBUMIN 4.2 3.2*   No results for input(s): LIPASE, AMYLASE in the last 168 hours. No results for input(s): AMMONIA in the last 168 hours. CBC: Recent Labs  Lab 03/27/21 1519 03/27/21 2056 03/28/21 0510 03/29/21 0808  WBC 7.9 8.3 6.4 7.1  NEUTROABS 5.3  --   --  5.0  HGB 11.0* 10.6* 9.9* 11.6*  HCT 31.8* 31.1* 29.1* 34.1*  MCV 82.2 81.0 80.6 81.8  PLT 329 292 277 333   Cardiac Enzymes: No  results for input(s): CKTOTAL, CKMB, CKMBINDEX, TROPONINI in the last 168 hours. BNP: Invalid input(s): POCBNP CBG: Recent Labs  Lab 03/28/21 0757 03/28/21 1134 03/28/21 1652 03/28/21 2122 03/29/21 0744  GLUCAP 141* 165* 133* 134* 149*   D-Dimer No results for input(s): DDIMER in the last 72 hours. Hgb A1c No results for input(s): HGBA1C in the last 72 hours. Lipid Profile No results for input(s): CHOL, HDL, LDLCALC, TRIG, CHOLHDL, LDLDIRECT in the last 72 hours. Thyroid function studies No results for input(s): TSH, T4TOTAL, T3FREE, THYROIDAB  in the last 72 hours.  Invalid input(s): FREET3 Anemia work up No results for input(s): VITAMINB12, FOLATE, FERRITIN, TIBC, IRON, RETICCTPCT in the last 72 hours. Urinalysis    Component Value Date/Time   COLORURINE YELLOW (A) 03/27/2021 1812   APPEARANCEUR HAZY (A) 03/27/2021 1812   LABSPEC 1.014 03/27/2021 1812   PHURINE 5.0 03/27/2021 1812   GLUCOSEU NEGATIVE 03/27/2021 1812   HGBUR SMALL (A) 03/27/2021 1812   BILIRUBINUR NEGATIVE 03/27/2021 1812   KETONESUR 5 (A) 03/27/2021 1812   PROTEINUR NEGATIVE 03/27/2021 1812   NITRITE NEGATIVE 03/27/2021 1812   LEUKOCYTESUR SMALL (A) 03/27/2021 1812   Sepsis Labs Invalid input(s): PROCALCITONIN,  WBC,  LACTICIDVEN Microbiology Recent Results (from the past 240 hour(s))  SARS CORONAVIRUS 2 (TAT 6-24 HRS) Nasopharyngeal Nasopharyngeal Swab     Status: None   Collection Time: 03/27/21  7:16 PM   Specimen: Nasopharyngeal Swab  Result Value Ref Range Status   SARS Coronavirus 2 NEGATIVE NEGATIVE Final    Comment: (NOTE) SARS-CoV-2 target nucleic acids are NOT DETECTED.  The SARS-CoV-2 RNA is generally detectable in upper and lower respiratory specimens during the acute phase of infection. Negative results do not preclude SARS-CoV-2 infection, do not rule out co-infections with other pathogens, and should not be used as the sole basis for treatment or other patient management  decisions. Negative results must be combined with clinical observations, patient history, and epidemiological information. The expected result is Negative.  Fact Sheet for Patients: HairSlick.no  Fact Sheet for Healthcare Providers: quierodirigir.com  This test is not yet approved or cleared by the [place] FDA and  has been authorized for detection and/or diagnosis of SARS-CoV-2 by FDA under an Emergency Use Authorization (EUA). This EUA will remain  in effect (meaning this test can be used) for the duration of the COVID-19 declaration under Se ction 564(b)(1) of the Act, 21 U.S.C. section 360bbb-3(b)(1), unless the authorization is terminated or revoked sooner.  Performed at [facility], [address], [place], [place] [address]      Time coordinating discharge: Over 30 minutes  SIGNED:   Tresa Moore, MD  [facility] 03/29/2021, 10:03 AM Pager   If 7PM-7AM, please contact night-coverage

## 2021-03-29 NOTE — TOC Initial Note (Signed)
Transition of Care [facility]) - Initial/Assessment Note    Patient Details  Name: Lorraine Wyatt MRN: [id number] Date of Birth: [birth year]  Transition of Care [facility]) CM/SW Contact:    Bing Quarry, RN Phone Number: 03/29/2021, 10:32 AM  Clinical Narrative: 9/24:  Sherron Monday with son regarding discharge planning with patient aware in the room. Disposition discussed as well as initial assessment taken. Weakness due to low serum sodium levels.  Patient lives alone in single story, no step (flat slab per son.one threshold half step up into house.  He lives close by and is also mobile in his job so is able to reach her within 5-10 minutes.  Granddaughter also helps out making patient lunch and checking on needs.  Patient lives alone with walker at baseline with history of Parkinson's Weakness due to low serum sodium levels.  Son transports to appointments, grocery store, etc on [place] when son is off work.  PCP still Dr. Fayrene Fearing Hedricks  [id number] - [place], [place] - [address] Barbie Damain Broadus RN CM                Expected Discharge Plan: Home w Home Health Services Barriers to Discharge: Barriers Resolved   Patient Goals and CMS Choice     Choice offered to / list presented to :  [facility] agency choices: no preference)  Expected Discharge Plan and Services Expected Discharge Plan: Home w Home Health Services   Discharge Planning Services: CM Consult Post Acute Care Choice: Home Health Living arrangements for the past 2 months: Single Family Home Expected Discharge Date: 03/29/21               DME Arranged: N/A DME Agency: NA       HH Arranged: PT HH Agency: [facility] Date HH Agency Contacted: 03/29/21 Time HH Agency Contacted: [phone]    Prior Living Arrangements/Services Living arrangements for the past 2 months: Single Family Home Lives with:: Self Patient language and need for interpreter reviewed::  No Do you feel safe going back to the place where you live?: Yes      Need for Family Participation in Patient Care: Yes (Comment) Care giver support system in place?: Yes (comment)   Criminal Activity/Legal Involvement Pertinent to Current Situation/Hospitalization: No - Comment as needed  Activities of Daily Living Home Assistive Devices/Equipment: Shower chair with back, Walker (specify type), Blood pressure cuff ADL Screening (condition at time of admission) [name] cognitive ability adequate to safely complete daily activities?: Yes Is the patient deaf or have difficulty hearing?: Yes Does the patient have difficulty seeing, even when wearing glasses/contacts?: No Does the patient have difficulty concentrating, remembering, or making decisions?: No Patient able to express need for assistance with ADLs?: Yes Does the patient have difficulty dressing or bathing?: No Independently performs ADLs?: Yes (appropriate for developmental age) Does the patient have difficulty walking or climbing stairs?: No Weakness of Legs: None Weakness of Arms/Hands: None  Permission Sought/Granted Permission sought to share information with : Case Manager, Family Supports, Magazine features editor Permission granted to share information with : Yes, Verbal Permission Granted  Share Information with NAME: Athalie Newhard  Permission granted to share info w AGENCY: Home Health agencies to secure services post discharge  Permission granted to share info w Relationship: Son [name]  Permission granted to share info w Contact Information: ARAYA, ROEL (Son)   [phone] [facility] Phone)  Emotional Assessment Appearance::  Appears stated age Attitude/Demeanor/Rapport: Lethargic (Wanted CM to speak to son present in room.) Affect (typically observed): Accepting Orientation: : Oriented to Self, Oriented to Place, Oriented to Situation Alcohol / Substance Use: Not Applicable Psych Involvement: No  (comment)  Admission diagnosis:  Urinary retention [R33.9] Hyponatremia [E87.1] Weakness [R53.1] Urinary tract infection without hematuria, site unspecified [N39.0] Patient Active Problem List   Diagnosis Date Noted   Hyponatremia 03/27/2021   Acute lower UTI 03/27/2021   Type 2 diabetes mellitus (HCC) 12/25/2019   TIA (transient ischemic attack) 07/18/2018   Urinary incontinence in female 11/05/2015   Parkinson's disease (HCC) 06/03/2015   Essential hypertension 06/17/2014   Mixed hyperlipidemia 06/17/2014   PCP:  Jerl Mina, MD Pharmacy:   [facility] #[id number] [place], [place] - [address] AT [facility] OF [address] [place] [address] Phone: [phone] Fax: [phone]     Social Determinants of Health (SDOH) Interventions    Readmission Risk Interventions No flowsheet data found.

## 2021-03-29 NOTE — TOC Transition Note (Addendum)
Transition of Care [facility]) - CM/SW Discharge Note   Patient Details  Name: JENEFER WOERNER MRN: [id number] Date of Birth: 1946-02-20  Transition of Care [facility]) CM/SW Contact:  Bing Quarry, RN Phone Number: 03/29/2021, 10:12 AM   Clinical Narrative:  Spoke with son regarding discharge planning with patient aware in the room. Disposition discussed as well as initial assessment taken. Weakness due to low serum sodium levels.  Patient lives alone in single story, no step (flat slab per son).  He lives close by and is also mobile in his job so is able to reach her within 5-10 minutes.  Granddaughter also helps out making patient lunch and checking on needs.  Patient lives alone with walker at baseline with history of Parkinson's Weakness due to low serum sodium levels.  Son transports to appointments, grocery store, etc on [place] when son is off work.  CM asked if son or patient had any other concerns regarding discharge plans and they did not.  [facility] was chosen per no preference by Patient or son.  Had not used any HH agency before though has all DME recommended at home per PT notes and confirmed with son/patient.  Explained Unit RN will go over AVS for other discharge instructions, medications, medications changes, or new medications, issues to call provider for, and follow up appointments.  PCP still Dr. Fayrene Fearing Hedricks  [facility] #[id number] - [place], [place] - [address] AT [address] Barbie Valoria Tamburri RN CM    Final next level of care: Home w Home Health Services Barriers to Discharge: Barriers Resolved   Patient Goals and CMS Choice     Choice offered to / list presented to :  [facility] agency choices: no preference)  Discharge Placement                Patient to be transferred to facility by: Son will transport home on discharge Name of family member notified: Maude Hettich [facility] Phone) Patient and family notified of of  transfer: 03/29/21  Discharge Plan and Services                DME Arranged: N/A DME Agency: NA       HH Arranged: NA HH Agency: NA        Social Determinants of Health (SDOH) Interventions     Readmission Risk Interventions No flowsheet data found.

## 2021-03-29 NOTE — Evaluation (Signed)
Physical Therapy Evaluation Patient Details Name: Lorraine Wyatt MRN: [id number] DOB: 08/22/1945 Today's Date: 03/29/2021  History of Present Illness  Patient admitted to the hospital with complaint of generalized weakness, urinary retention and significant hyponatremia; PMH includes diabetes, hypertension, hyperlipidemia, Parkinson's disease    Clinical Impression  Pt received sitting in recliner with son in room, agreeable to PT. Pt reports living alone in a single level home, uses RW at baseline and has a level entry to home. She has family available as needed. Pt performed STS and 244ft of ambulation, both with RW and SUP for safety - no physical assistance required. She does present with generalized weakness globally and mild gait deviations appearing to be 2/2 PD. Pt also reported decreased endurance with activity and ambulation compared to baseline. Rec HHPT to improve strength and functional endurance. Would benefit from skilled PT to address above deficits and promote optimal return to PLOF.      Recommendations for follow up therapy are one component of a multi-disciplinary discharge planning process, led by the attending physician.  Recommendations may be updated based on patient status, additional functional criteria and insurance authorization.  Follow Up Recommendations Home health PT    Equipment Recommendations  None recommended by PT    Recommendations for Other Services       Precautions / Restrictions Precautions Precautions: Fall Restrictions Weight Bearing Restrictions: No      Mobility  Bed Mobility               General bed mobility comments: not observed - pt in recliner    Transfers Overall transfer level: Needs assistance Equipment used: Rolling walker (2 wheeled) Transfers: Sit to/from Stand Sit to Stand: Supervision            Ambulation/Gait Ambulation/Gait assistance: Supervision Gait Distance (Feet): 220 Feet Assistive device:  Rolling walker (2 wheeled) Gait Pattern/deviations: Step-through pattern;Trunk flexed;Narrow base of support Gait velocity: decreased   General Gait Details: Forward posture due to kyphotic curvature. Decreased gait velocity with narrow BOS and decreased foot clearance bilaterally, however no shuffling. Decreased stride length bilaterally.  Stairs            Wheelchair Mobility    Modified Rankin (Stroke Patients Only)       Balance Overall balance assessment: Needs assistance Sitting-balance support: Feet supported Sitting balance-Leahy Scale: Good Sitting balance - Comments: forward in recliner, no back support   Standing balance support: Bilateral upper extremity supported;During functional activity Standing balance-Leahy Scale: Fair Standing balance comment: was able to don mask in standing without UE support. Required UE support of RW during ambulation                             Pertinent Vitals/Pain Pain Assessment: 0-10 Pain Score: 8  Pain Location: Cervical and R shoulder Pain Intervention(s): Limited activity within patient's tolerance;Monitored during session;Premedicated before session    Home Living Family/patient expects to be discharged to:: Private residence Living Arrangements: Alone Available Help at Discharge: Available PRN/intermittently;Family Type of Home: House Home Access: Level entry     Home Layout: One level Home Equipment: Shower seat;Hand held shower head;Grab bars - tub/shower;Grab bars - toilet;Walker - 2 wheels;Bedside commode Additional Comments: Ambulates with RW at baseline    Prior Function Level of Independence: Independent with assistive device(s)         Comments: Assist with IADLs from grandaugther; MOD I- indepedent in ADL per pt report. 1  fall in the past 6 months.     Hand Dominance        Extremity/Trunk Assessment   Upper Extremity Assessment Upper Extremity Assessment: Generalized weakness     Lower Extremity Assessment Lower Extremity Assessment: Generalized weakness    Cervical / Trunk Assessment Cervical / Trunk Assessment: Kyphotic  Communication   Communication: No difficulties  Cognition Arousal/Alertness: Awake/alert Behavior During Therapy: WFL for tasks assessed/performed Overall Cognitive Status: Within Functional Limits for tasks assessed                                 General Comments: Alert and oriented x4      General Comments      Exercises     Assessment/Plan    PT Assessment Patient needs continued PT services  PT Problem List Decreased strength;Decreased activity tolerance;Decreased balance;Decreased mobility       PT Treatment Interventions Balance training;Gait training;Neuromuscular re-education;Functional mobility training;Therapeutic activities;Therapeutic exercise;Patient/family education    PT Goals (Current goals can be found in the Care Plan section)  Acute Rehab PT Goals Patient Stated Goal: to go home today PT Goal Formulation: With patient Time For Goal Achievement: 04/12/21 Potential to Achieve Goals: Good    Frequency Min 2X/week   Barriers to discharge        Co-evaluation               AM-PAC PT "6 Clicks" Mobility  Outcome Measure Help needed turning from your back to your side while in a flat bed without using bedrails?: None Help needed moving from lying on your back to sitting on the side of a flat bed without using bedrails?: None Help needed moving to and from a bed to a chair (including a wheelchair)?: A Little Help needed standing up from a chair using your arms (e.g., wheelchair or bedside chair)?: A Little Help needed to walk in hospital room?: A Little Help needed climbing 3-5 steps with a railing? : A Lot 6 Click Score: 19    End of Session Equipment Utilized During Treatment: Gait belt Activity Tolerance: Patient tolerated treatment well;Patient limited by fatigue Patient  left: in chair;with call bell/phone within reach;with chair alarm set Nurse Communication: Mobility status PT Visit Diagnosis: Unsteadiness on feet (R26.81);Other abnormalities of gait and mobility (R26.89);Muscle weakness (generalized) (M62.81)    Time: 0947-0962 PT Time Calculation (min) (ACUTE ONLY): 19 min   Charges:   PT Evaluation $PT Eval Moderate Complexity: 1 Mod PT Treatments $Therapeutic Activity: 8-22 mins        Basilia Jumbo PT, DPT 03/29/21 9:52 AM [phone]   Lavenia Atlas 03/29/2021, 9:47 AM

## 2021-03-30 LAB — HEMOGLOBIN A1C
Hgb A1c MFr Bld: 6 % — ABNORMAL HIGH (ref 4.8–5.6)
Mean Plasma Glucose: 126 mg/dL

## 2021-07-17 ENCOUNTER — Other Ambulatory Visit: Payer: Self-pay | Admitting: Family Medicine

## 2021-07-17 DIAGNOSIS — Z1231 Encounter for screening mammogram for malignant neoplasm of breast: Secondary | ICD-10-CM

## 2021-10-01 ENCOUNTER — Ambulatory Visit
Admission: RE | Admit: 2021-10-01 | Discharge: 2021-10-01 | Disposition: A | Discharge status: Home / Self Care | Payer: Medicare Other | Source: Ambulatory Visit | Attending: Family Medicine | Admitting: Family Medicine

## 2021-10-01 DIAGNOSIS — Z1231 Encounter for screening mammogram for malignant neoplasm of breast: Secondary | ICD-10-CM | POA: Diagnosis not present

## 2021-10-05 ENCOUNTER — Ambulatory Visit (LOCAL_COMMUNITY_HEALTH_CENTER): Payer: Self-pay

## 2021-10-05 DIAGNOSIS — Z719 Counseling, unspecified: Secondary | ICD-10-CM

## 2021-10-05 NOTE — Progress Notes (Signed)
Patient in nurse clinic and states they need COVID bivalent booster. Records and COVID Card indicate that patient already received Bivalent booster on 04/20/2021. Advised patient that they are caught up and cannot receive any more doses of COVID vaccine. Patient verbalized understanding. No immunizations given today. Ann Held, RN ? ?

## 2021-10-06 ENCOUNTER — Other Ambulatory Visit: Payer: Self-pay | Admitting: Family Medicine

## 2021-10-06 DIAGNOSIS — R928 Other abnormal and inconclusive findings on diagnostic imaging of breast: Secondary | ICD-10-CM

## 2021-10-06 DIAGNOSIS — N63 Unspecified lump in unspecified breast: Secondary | ICD-10-CM

## 2021-10-26 ENCOUNTER — Other Ambulatory Visit: Payer: Medicare Other

## 2021-11-02 ENCOUNTER — Ambulatory Visit
Admission: RE | Admit: 2021-11-02 | Discharge: 2021-11-02 | Disposition: A | Discharge status: Home / Self Care | Payer: Medicare Other | Source: Ambulatory Visit | Attending: Family Medicine | Admitting: Family Medicine

## 2021-11-02 DIAGNOSIS — N63 Unspecified lump in unspecified breast: Secondary | ICD-10-CM | POA: Insufficient documentation

## 2021-11-02 DIAGNOSIS — R928 Other abnormal and inconclusive findings on diagnostic imaging of breast: Secondary | ICD-10-CM | POA: Insufficient documentation

## 2022-02-18 ENCOUNTER — Other Ambulatory Visit: Payer: Self-pay | Admitting: Family Medicine

## 2022-02-18 DIAGNOSIS — R928 Other abnormal and inconclusive findings on diagnostic imaging of breast: Secondary | ICD-10-CM

## 2022-02-18 DIAGNOSIS — N6320 Unspecified lump in the left breast, unspecified quadrant: Secondary | ICD-10-CM

## 2022-03-19 ENCOUNTER — Emergency Department: Payer: Medicare Other

## 2022-03-19 ENCOUNTER — Other Ambulatory Visit: Payer: Self-pay

## 2022-03-19 ENCOUNTER — Observation Stay
Admission: EM | Admit: 2022-03-19 | Discharge: 2022-03-20 | Disposition: A | Discharge status: Home Health | Payer: Medicare Other | Attending: Obstetrics and Gynecology | Admitting: Obstetrics and Gynecology

## 2022-03-19 DIAGNOSIS — G20 Parkinson's disease: Secondary | ICD-10-CM | POA: Insufficient documentation

## 2022-03-19 DIAGNOSIS — I10 Essential (primary) hypertension: Secondary | ICD-10-CM | POA: Insufficient documentation

## 2022-03-19 DIAGNOSIS — R42 Dizziness and giddiness: Principal | ICD-10-CM

## 2022-03-19 DIAGNOSIS — E871 Hypo-osmolality and hyponatremia: Secondary | ICD-10-CM | POA: Diagnosis not present

## 2022-03-19 DIAGNOSIS — G459 Transient cerebral ischemic attack, unspecified: Secondary | ICD-10-CM | POA: Diagnosis not present

## 2022-03-19 DIAGNOSIS — R000 Tachycardia, unspecified: Secondary | ICD-10-CM | POA: Diagnosis not present

## 2022-03-19 DIAGNOSIS — D649 Anemia, unspecified: Secondary | ICD-10-CM | POA: Diagnosis not present

## 2022-03-19 DIAGNOSIS — Z79899 Other long term (current) drug therapy: Secondary | ICD-10-CM | POA: Diagnosis not present

## 2022-03-19 DIAGNOSIS — R0602 Shortness of breath: Secondary | ICD-10-CM | POA: Diagnosis not present

## 2022-03-19 DIAGNOSIS — E860 Dehydration: Secondary | ICD-10-CM | POA: Diagnosis not present

## 2022-03-19 DIAGNOSIS — N390 Urinary tract infection, site not specified: Secondary | ICD-10-CM

## 2022-03-19 DIAGNOSIS — Z7984 Long term (current) use of oral hypoglycemic drugs: Secondary | ICD-10-CM | POA: Insufficient documentation

## 2022-03-19 DIAGNOSIS — E119 Type 2 diabetes mellitus without complications: Secondary | ICD-10-CM | POA: Insufficient documentation

## 2022-03-19 LAB — BASIC METABOLIC PANEL
Anion gap: 12 (ref 5–15)
BUN: 21 mg/dL (ref 8–23)
CO2: 21 mmol/L — ABNORMAL LOW (ref 22–32)
Calcium: 9.3 mg/dL (ref 8.9–10.3)
Chloride: 94 mmol/L — ABNORMAL LOW (ref 98–111)
Creatinine, Ser: 1.02 mg/dL — ABNORMAL HIGH (ref 0.44–1.00)
GFR, Estimated: 57 mL/min — ABNORMAL LOW (ref >60)
Glucose, Bld: 102 mg/dL — ABNORMAL HIGH (ref 70–99)
Potassium: 4.2 mmol/L (ref 3.5–5.1)
Sodium: 127 mmol/L — ABNORMAL LOW (ref 135–145)

## 2022-03-19 LAB — URINALYSIS, ROUTINE W REFLEX MICROSCOPIC
Bilirubin Urine: NEGATIVE
Glucose, UA: NEGATIVE mg/dL
Hgb urine dipstick: NEGATIVE
Ketones, ur: 5 mg/dL — AB
Nitrite: NEGATIVE
Protein, ur: 30 mg/dL — AB
Specific Gravity, Urine: 1.016 (ref 1.005–1.030)
pH: 5 (ref 5.0–8.0)

## 2022-03-19 LAB — CBC
HCT: 32.2 % — ABNORMAL LOW (ref 36.0–46.0)
Hemoglobin: 10.3 g/dL — ABNORMAL LOW (ref 12.0–15.0)
MCH: 25.9 pg — ABNORMAL LOW (ref 26.0–34.0)
MCHC: 32 g/dL (ref 30.0–36.0)
MCV: 80.9 fL (ref 80.0–100.0)
Platelets: 336 10*3/uL (ref 150–400)
RBC: 3.98 MIL/uL (ref 3.87–5.11)
RDW: 14 % (ref 11.5–15.5)
WBC: 8.5 10*3/uL (ref 4.0–10.5)
nRBC: 0 % (ref 0.0–0.2)

## 2022-03-19 LAB — D-DIMER, QUANTITATIVE: D-Dimer, Quant: 0.35 ug/mL-FEU (ref 0.00–0.50)

## 2022-03-19 LAB — CBG MONITORING, ED
Glucose-Capillary: 90 mg/dL (ref 70–99)
Glucose-Capillary: 159 mg/dL — ABNORMAL HIGH (ref 70–99)

## 2022-03-19 LAB — BRAIN NATRIURETIC PEPTIDE: B Natriuretic Peptide: 68.1 pg/mL (ref 0.0–100.0)

## 2022-03-19 MED ORDER — HYDRALAZINE HCL 20 MG/ML IJ SOLN: 5.0000 mg | Freq: Four times a day (QID) | INTRAMUSCULAR | Status: DC | PRN

## 2022-03-19 MED ORDER — ROPINIROLE HCL 0.25 MG PO TABS
0.2500 mg | ORAL_TABLET | Freq: Three times a day (TID) | ORAL | Status: DC
  Filled 2022-03-19 (×4): qty 1

## 2022-03-19 MED ORDER — ACETAMINOPHEN 650 MG RE SUPP: 650.0000 mg | Freq: Four times a day (QID) | RECTAL | Status: DC | PRN

## 2022-03-19 MED ORDER — ONDANSETRON HCL 4 MG/2ML IJ SOLN
4.0000 mg | INTRAMUSCULAR | Status: AC
  Administered 2022-03-19: 4 mg via INTRAVENOUS
  Filled 2022-03-19: qty 2

## 2022-03-19 MED ORDER — SODIUM CHLORIDE 0.9 % IV BOLUS
500.0000 mL | Freq: Once | INTRAVENOUS | Status: AC
  Administered 2022-03-19: 500 mL via INTRAVENOUS

## 2022-03-19 MED ORDER — SODIUM CHLORIDE 0.9 % IV SOLN: INTRAVENOUS | Status: DC

## 2022-03-19 MED ORDER — SODIUM CHLORIDE 0.9 % IV SOLN
1.0000 g | INTRAVENOUS | Status: AC
  Administered 2022-03-19: 1 g via INTRAVENOUS
  Filled 2022-03-19: qty 10

## 2022-03-19 MED ORDER — SODIUM CHLORIDE 0.9% FLUSH
3.0000 mL | Freq: Two times a day (BID) | INTRAVENOUS | Status: DC
  Administered 2022-03-19 – 2022-03-20 (×2): 3 mL via INTRAVENOUS

## 2022-03-19 MED ORDER — SODIUM CHLORIDE 1 G PO TABS
1.0000 g | ORAL_TABLET | Freq: Once | ORAL | Status: AC
  Administered 2022-03-19: 1 g via ORAL
  Filled 2022-03-19: qty 1

## 2022-03-19 MED ORDER — HEPARIN SODIUM (PORCINE) 5000 UNIT/ML IJ SOLN
5000.0000 [IU] | Freq: Two times a day (BID) | INTRAMUSCULAR | Status: DC
  Administered 2022-03-19 – 2022-03-20 (×2): 5000 [IU] via SUBCUTANEOUS
  Filled 2022-03-19 (×2): qty 1

## 2022-03-19 MED ORDER — LOSARTAN POTASSIUM 50 MG PO TABS
25.0000 mg | ORAL_TABLET | ORAL | Status: AC
  Administered 2022-03-19: 25 mg via ORAL
  Filled 2022-03-19: qty 1

## 2022-03-19 MED ORDER — INSULIN ASPART 100 UNIT/ML IJ SOLN
0.0000 [IU] | Freq: Three times a day (TID) | INTRAMUSCULAR | Status: DC
  Administered 2022-03-20 (×2): 1 [IU] via SUBCUTANEOUS
  Filled 2022-03-19 (×2): qty 1

## 2022-03-19 MED ORDER — PANTOPRAZOLE SODIUM 40 MG IV SOLR
40.0000 mg | Freq: Two times a day (BID) | INTRAVENOUS | Status: DC
  Administered 2022-03-19: 40 mg via INTRAVENOUS
  Filled 2022-03-19: qty 10

## 2022-03-19 MED ORDER — ACETAMINOPHEN 325 MG PO TABS
650.0000 mg | ORAL_TABLET | Freq: Four times a day (QID) | ORAL | Status: DC | PRN
  Administered 2022-03-20: 650 mg via ORAL
  Filled 2022-03-19: qty 2

## 2022-03-19 MED ORDER — ACETAMINOPHEN 500 MG PO TABS
1000.0000 mg | ORAL_TABLET | ORAL | Status: DC
  Filled 2022-03-19: qty 2

## 2022-03-19 MED ORDER — CARVEDILOL 3.125 MG PO TABS
3.1250 mg | ORAL_TABLET | Freq: Two times a day (BID) | ORAL | Status: DC
  Administered 2022-03-20: 3.125 mg via ORAL
  Filled 2022-03-19: qty 1

## 2022-03-19 MED ORDER — CARBIDOPA-LEVODOPA 25-250 MG PO TABS
1.0000 | ORAL_TABLET | Freq: Three times a day (TID) | ORAL | Status: DC
  Filled 2022-03-19: qty 1

## 2022-03-19 NOTE — ED Notes (Signed)
Pt consumed half arbys roast beef sandwich, jalapeno popper and a few fries.  Pt then up with assistance and ambulated approx 50 ft with rolling walker.  Stand by assist provided.  Pt continues to state that she wants to go home tonight and refuses admission.

## 2022-03-19 NOTE — Assessment & Plan Note (Signed)
Vitals:   03/19/22 1511 03/19/22 1753 03/19/22 1800 03/19/22 1900  BP: (!) 166/117 (!) 190/92 (!) 199/105 (!) 247/167   03/19/22 1930 03/19/22 2030 03/19/22 2200 03/19/22 2230  BP: (!) 195/169 107/77 (!) 159/102 (!) 172/84  Currently we will continue patient on Coreg and as needed hydralazine. We will resume patient's losartan after we have orthostatic vitals. 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

## 2022-03-19 NOTE — ED Triage Notes (Signed)
Pt from home via EMS for c/o dizziness and "not feeling like herself today." Pt is AOX4, NAD noted. Pt has Parkinson's. Pt denies pain, SHOB, CP, N/V/D.

## 2022-03-19 NOTE — ED Notes (Signed)
Family here with meal and pt's personal walker.

## 2022-03-19 NOTE — H&P (Incomplete)
History and Physical    Chief Complaint: Dizziness.    HISTORY OF PRESENT ILLNESS: Lorraine Wyatt is an 76 y.o. female seen today for dizziness and generalized weakness. Per ed pt reported polyuria but pt reports that she has no trouble with urination.  Pt request something for pain in her neck and sleep medication trazodone.  Patient also reports palpitation, that have been chronic. She has seen heart md and was told She has tachycardia.   Pt has PMH of: Past Medical History:  Diagnosis Date  . Diabetes mellitus without complication ([place])   . Hyperlipidemia   . Hypertension   . Parkinson disease ([place])    Review Of Systems: Review of Systems  Unable to perform ROS: Age    ALLERGIES: Allergies  Allergen Reactions  . Clarithromycin   . Erythromycin Ethylsuccinate   . Trandolapril     PAST SURGICAL HISTORY: Past Surgical History:  Procedure Laterality Date  . BREAST CYST ASPIRATION Right   . COLONOSCOPY WITH PROPOFOL N/A 12/12/2017   Procedure: COLONOSCOPY WITH PROPOFOL;  Surgeon: Manya Silvas, MD;  Location: [facility] ENDOSCOPY;  Service: Endoscopy;  Laterality: N/A;  . ESOPHAGOGASTRODUODENOSCOPY (EGD) WITH PROPOFOL N/A 12/12/2017   Procedure: ESOPHAGOGASTRODUODENOSCOPY (EGD) WITH PROPOFOL;  Surgeon: Manya Silvas, MD;  Location: [facility] ENDOSCOPY;  Service: Endoscopy;  Laterality: N/A;     SOCIAL HISTORY: Social History   Socioeconomic History  . Marital status: Widowed    Spouse name: Not on file  . Number of children: Not on file  . Years of education: Not on file  . Highest education level: Not on file  Occupational History  . Not on file  Tobacco Use  . Smoking status: Never  . Smokeless tobacco: Never  Vaping Use  . Vaping Use: Never used  Substance and Sexual Activity  . Alcohol use: Not Currently  . Drug use: Never  . Sexual activity: Not on file  Other Topics Concern  . Not on file  Social History Narrative  . Not on file   Social  Determinants of Health   Financial Resource Strain: Not on file  Food Insecurity: Not on file  Transportation Needs: Not on file  Physical Activity: Not on file  Stress: Not on file  Social Connections: Not on file      CURRENT MEDS:  Current Facility-Administered Medications (Endocrine & Metabolic):  Derrill Memo ON 03/20/2022] insulin aspart (novoLOG) injection 0-9 Units  Current Outpatient Medications (Endocrine & Metabolic):  .  glipiZIDE (GLUCOTROL) 5 MG tablet, Take 5 mg by mouth daily before breakfast.  .  metFORMIN (GLUCOPHAGE) 1000 MG tablet, Take 1,000 mg by mouth 2 (two) times daily with a meal. .  sitaGLIPtin (JANUVIA) 100 MG tablet, Take 100 mg by mouth daily.  Current Facility-Administered Medications (Cardiovascular):  Derrill Memo ON 03/20/2022] carvedilol (COREG) tablet 3.125 mg .  hydrALAZINE (APRESOLINE) injection 5 mg  Current Outpatient Medications (Cardiovascular):  .  carvedilol (COREG) 3.125 MG tablet, Take 3.125 mg by mouth 2 (two) times daily with a meal. .  losartan (COZAAR) 25 MG tablet, Take 25 mg by mouth daily. .  pravastatin (PRAVACHOL) 40 MG tablet, Take 40 mg by mouth daily.    Current Facility-Administered Medications (Analgesics):  .  acetaminophen (TYLENOL) tablet 650 mg **OR** acetaminophen (TYLENOL) suppository 650 mg .  acetaminophen (TYLENOL) tablet 1,000 mg   Current Facility-Administered Medications (Hematological):  .  heparin injection 5,000 Units  Current Outpatient Medications (Hematological):  .  cyanocobalamin (,VITAMIN B-12,)  1000 MCG/ML injection, Inject 1 mL into the muscle every 30 (thirty) days.  Current Facility-Administered Medications (Other):  .  0.9 %  sodium chloride infusion .  carbidopa-levodopa (SINEMET IR) 25-250 MG per tablet immediate release 1 tablet .  pantoprazole (PROTONIX) injection 40 mg .  rOPINIRole (REQUIP) tablet 0.25 mg .  sodium chloride flush (NS) 0.9 % injection 3 mL  Current Outpatient  Medications (Other):  .  carbidopa-levodopa (SINEMET IR) 25-250 MG tablet, Take 1 tablet by mouth 3 (three) times daily. Marland Kitchen  omeprazole (PRILOSEC) 20 MG capsule, Take 20 mg by mouth daily. Marland Kitchen  rOPINIRole (REQUIP) 0.25 MG tablet, Take 0.25 mg by mouth 3 (three) times daily.    ED Course: Pt in Ed is alert,awake and oriented. Meets SIRS criteria no infection suspected.  > Vitals:   03/19/22 1930 03/19/22 2030 03/19/22 2200 03/19/22 2230  BP: (!) 195/169 107/77 (!) 159/102 (!) 172/84  Pulse: (!) 105 100 (!) 114 (!) 104  Resp: (!) 25 (!) 22 19 (!) 22  Temp: 97.9 F (36.6 C) 98.3 F (36.8 C)  97.9 F (36.6 C)  TempSrc: Oral Oral    SpO2: 93% 100% 99% 100%   >Total I/O In: 598.4 [IV Piggyback:598.4] Out: -  >SpO2: 100 % >labs shows hyponatremia and  normal anion gap acidosis with bicarb of 21 and AKI.  Results for orders placed or performed during the hospital encounter of 03/19/22 (from the past 24 hour(s))  Basic metabolic panel     Status: Abnormal   Collection Time: 03/19/22  3:14 PM  Result Value Ref Range   Sodium 127 (L) 135 - 145 mmol/L   Potassium 4.2 3.5 - 5.1 mmol/L   Chloride 94 (L) 98 - 111 mmol/L   CO2 21 (L) 22 - 32 mmol/L   Glucose, Bld 102 (H) 70 - 99 mg/dL   BUN 21 8 - 23 mg/dL   Creatinine, Ser 3.22 (H) 0.44 - 1.00 mg/dL   Calcium 9.3 8.9 - 02.5 mg/dL   GFR, Estimated 57 (L) >60 mL/min   Anion gap 12 5 - 15  CBC     Status: Abnormal   Collection Time: 03/19/22  3:14 PM  Result Value Ref Range   WBC 8.5 4.0 - 10.5 K/uL   RBC 3.98 3.87 - 5.11 MIL/uL   Hemoglobin 10.3 (L) 12.0 - 15.0 g/dL   HCT 42.7 (L) 06.2 - 37.6 %   MCV 80.9 80.0 - 100.0 fL   MCH 25.9 (L) 26.0 - 34.0 pg   MCHC 32.0 30.0 - 36.0 g/dL   RDW 28.3 15.1 - 76.1 %   Platelets 336 150 - 400 K/uL   nRBC 0.0 0.0 - 0.2 %  Urinalysis, Routine w reflex microscopic     Status: Abnormal   Collection Time: 03/19/22  3:14 PM  Result Value Ref Range   Color, Urine YELLOW (A) YELLOW   APPearance CLEAR  (A) CLEAR   Specific Gravity, Urine 1.016 1.005 - 1.030   pH 5.0 5.0 - 8.0   Glucose, UA NEGATIVE NEGATIVE mg/dL   Hgb urine dipstick NEGATIVE NEGATIVE   Bilirubin Urine NEGATIVE NEGATIVE   Ketones, ur 5 (A) NEGATIVE mg/dL   Protein, ur 30 (A) NEGATIVE mg/dL   Nitrite NEGATIVE NEGATIVE   Leukocytes,Ua TRACE (A) NEGATIVE   RBC / HPF 0-5 0 - 5 RBC/hpf   WBC, UA 0-5 0 - 5 WBC/hpf   Bacteria, UA RARE (A) NONE SEEN   Squamous Epithelial / LPF 0-5  0 - 5   Mucus PRESENT   Brain natriuretic peptide     Status: None   Collection Time: 03/19/22  3:14 PM  Result Value Ref Range   B Natriuretic Peptide 68.1 0.0 - 100.0 pg/mL  D-dimer, quantitative     Status: None   Collection Time: 03/19/22  6:47 PM  Result Value Ref Range   D-Dimer, Quant 0.35 0.00 - 0.50 ug/mL-FEU  CBG monitoring, ED     Status: None   Collection Time: 03/19/22  8:32 PM  Result Value Ref Range   Glucose-Capillary 90 70 - 99 mg/dL  POC CBG, ED     Status: Abnormal   Collection Time: 03/19/22 10:29 PM  Result Value Ref Range   Glucose-Capillary 159 (H) 70 - 99 mg/dL   Meds ordered this encounter  Medications  . losartan (COZAAR) tablet 25 mg  . sodium chloride 0.9 % bolus 500 mL  . cefTRIAXone (ROCEPHIN) 1 g in sodium chloride 0.9 % 100 mL IVPB    Order Specific Question:   Antibiotic Indication:    Answer:   UTI  . sodium chloride tablet 1 g  . ondansetron (ZOFRAN) injection 4 mg  . sodium chloride 0.9 % bolus 500 mL  . acetaminophen (TYLENOL) tablet 1,000 mg  . carbidopa-levodopa (SINEMET IR) 25-250 MG per tablet immediate release 1 tablet  . carvedilol (COREG) tablet 3.125 mg  . rOPINIRole (REQUIP) tablet 0.25 mg  . heparin injection 5,000 Units  . sodium chloride flush (NS) 0.9 % injection 3 mL  . 0.9 %  sodium chloride infusion  . OR Linked Order Group   . acetaminophen (TYLENOL) tablet 650 mg   . acetaminophen (TYLENOL) suppository 650 mg  . hydrALAZINE (APRESOLINE) injection 5 mg  . pantoprazole  (PROTONIX) injection 40 mg  . insulin aspart (novoLOG) injection 0-9 Units    Order Specific Question:   Correction coverage:    Answer:   Sensitive (thin, NPO, renal)    Order Specific Question:   CBG < 70:    Answer:   Implement Hypoglycemia Standing Orders and refer to Hypoglycemia Standing Orders sidebar report    Order Specific Question:   CBG 70 - 120:    Answer:   0 units    Order Specific Question:   CBG 121 - 150:    Answer:   1 unit    Order Specific Question:   CBG 151 - 200:    Answer:   2 units    Order Specific Question:   CBG 201 - 250:    Answer:   3 units    Order Specific Question:   CBG 251 - 300:    Answer:   5 units    Order Specific Question:   CBG 301 - 350:    Answer:   7 units    Order Specific Question:   CBG 351 - 400    Answer:   9 units    Order Specific Question:   CBG > 400    Answer:   call MD and obtain STAT lab verification   Admission Imaging : DG Chest Port 1 View  Result Date: 03/19/2022 CLINICAL DATA:  Dizziness EXAM: PORTABLE CHEST 1 VIEW COMPARISON:  None Available. FINDINGS: Transverse diameter of heart is slightly increased. There are no signs of pulmonary edema or focal pulmonary consolidation. There is no pleural effusion or pneumothorax. [name] chin is partially obscuring the left apex. IMPRESSION: No active disease. Electronically Signed   By: Royston Cowper  Rathinasamy M.D.   On: 03/19/2022 18:36      Physical Examination: Vitals:   03/19/22 1930 03/19/22 2030 03/19/22 2200 03/19/22 2230  BP: (!) 195/169 107/77 (!) 159/102 (!) 172/84  Pulse: (!) 105 100 (!) 114 (!) 104  Temp: 97.9 F (36.6 C) 98.3 F (36.8 C)  97.9 F (36.6 C)  Resp: (!) 25 (!) 22 19 (!) 22  SpO2: 93% 100% 99% 100%  TempSrc: Oral Oral     Physical Exam Vitals and nursing note reviewed.  Constitutional:      General: She is not in acute distress.    Appearance: She is not ill-appearing, toxic-appearing or diaphoretic.  HENT:     Head: Normocephalic and  atraumatic.     Right Ear: Hearing and external ear normal.     Left Ear: Hearing and external ear normal.     Nose: Nose normal. No nasal deformity.     Mouth/Throat:     Lips: Pink.     Mouth: Mucous membranes are dry.     Tongue: No lesions.  Eyes:     Extraocular Movements: Extraocular movements intact.     Pupils: Pupils are equal, round, and reactive to light.  Cardiovascular:     Rate and Rhythm: Regular rhythm. Tachycardia present.     Pulses: Normal pulses.     Heart sounds: Normal heart sounds.  Pulmonary:     Effort: Pulmonary effort is normal.     Breath sounds: Normal breath sounds.  Abdominal:     General: Bowel sounds are normal. There is no distension.     Palpations: Abdomen is soft. There is no mass.     Tenderness: There is no abdominal tenderness. There is no guarding.     Hernia: No hernia is present.  Musculoskeletal:     Right lower leg: No edema.     Left lower leg: No edema.  Skin:    General: Skin is warm.  Neurological:     General: No focal deficit present.     Mental Status: She is alert and oriented to person, place, and time.     Cranial Nerves: Cranial nerves 2-12 are intact.     Motor: Motor function is intact.  Psychiatric:        Attention and Perception: Attention normal.        Mood and Affect: Mood normal.        Speech: Speech normal.        Behavior: Behavior normal.    Assessment and Plan: * Dizziness Attribute patient's symptoms of dizziness and generalized weakness to dehydration as reflected by hyponatremia and hypochloremia and mild acute kidney injury. Vitals:   03/19/22 1511 03/19/22 1753 03/19/22 1800 03/19/22 1900  BP: (!) 166/117 (!) 190/92 (!) 199/105 (!) 247/167   03/19/22 1930 03/19/22 2030 03/19/22 2200 03/19/22 2230  BP: (!) 195/169 107/77 (!) 159/102 (!) 172/84  Suspect patient may actually be orthostatic and hypotensive giving rise to tachycardia and we will check orthostatic vitals. Fall precautions. Patient  is nonfocal on her neuro exam.  She is ambulatory with a rollator at baseline and lives alone and is able to care for herself.   Hyponatremia    Latest Ref Rng & Units 03/19/2022    3:14 PM 03/29/2021    8:08 AM 03/28/2021    5:10 AM  BMP  Glucose 70 - 99 mg/dL 270  350  093   BUN 8 - 23 mg/dL 21  13  17  Creatinine 0.44 - 1.00 mg/dL 1.02  0.95  0.93   Sodium 135 - 145 mmol/L 127  129  130   Potassium 3.5 - 5.1 mmol/L 4.2  4.0  3.0   Chloride 98 - 111 mmol/L 94  94  95   CO2 22 - 32 mmol/L 21  22  25    Calcium 8.9 - 10.3 mg/dL 9.3  9.1  8.1    Patient has history of chronic hyponatremia and has a history of diuretic use which she is not on any longer which I suspect is secondary to her hyponatremia presentation. Gentle hydration overnight with normal saline.   Essential hypertension Vitals:   03/19/22 1511 03/19/22 1753 03/19/22 1800 03/19/22 1900  BP: (!) 166/117 (!) 190/92 (!) 199/105 (!) 247/167   03/19/22 1930 03/19/22 2030 03/19/22 2200 03/19/22 2230  BP: (!) 195/169 107/77 (!) 159/102 (!) 172/84  Currently we will continue patient on Coreg and as needed hydralazine. We will resume patient's losartan after we have orthostatic vitals. 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    Type 2 diabetes mellitus ([place]) Do not suspect patient is a diabetic. We will weigh patient. Would recommend discontinuation of metformin in her case due to Risk of lactic acidosis which may also be contributing to her sinus tachycardia. We will check patient's lactic acid level. SSI regimen and a1c.   Sinus tachycardia Sinus tach at 110 on ekg.  Suspect underlying lactic acidosis.  Also check tsh and ft4.  Also cont coreg.  Correct electrolytes.    Parkinson's disease ([place]) Pt continued on Sinemet.   TIA (transient ischemic attack) Pt continued on coreg and PRN  hydralazine.    Unresulted Labs (From admission, onward)     Start     Ordered   03/20/22 0500  Comprehensive metabolic panel  Tomorrow morning,   STAT        03/19/22 2309   03/20/22 0500  CBC  Tomorrow morning,   STAT        03/19/22 2309   03/19/22 2305  Hemoglobin A1c  Add-on,   AD        03/19/22 2309   03/19/22 2300  Lactic acid, plasma  STAT Now then every 3 hours,   STAT      03/19/22 2259   03/19/22 1820  Blood culture (routine x 2)  BLOOD CULTURE X 2,   STAT      03/19/22 1819            DVT prophylaxis:  Heparin q12H  Code Status:  Full code    Family Communication:  KIM, CALVO (Son)  [phone] (Home Phone   Disposition Plan:  HOME  Consults called:  None   Admission status: Observation.    Unit/ Expected LOS: Med tele/ 1 day.    Para Skeans MD [facility]  6 PM- 2 AM. Please contact me via secure Chat 6 PM-2 AM. [phone] ( Pager ) To contact the [facility] Attending or Consulting provider [place] or covering provider during after hours [place], for this patient.   Check the care team in [facility] and look for a) attending/consulting [facility] provider listed and b) the [facility] team listed Log into [email or web address] and use [place]'s universal password to access. If you do not have the password, please contact the hospital operator. Locate the [facility] provider you  are looking for under [facility] and page to a number that you can be directly reached. If you still have difficulty reaching the provider, please page the [facility] (Director on Call) for the Hospitalists listed on amion for assistance. [email or web address] 03/19/2022, 11:29 PM

## 2022-03-19 NOTE — Assessment & Plan Note (Signed)
Pt continued on Sinemet.

## 2022-03-19 NOTE — ED Notes (Signed)
Pure wick replaced. Pt refused tylenol at this time.

## 2022-03-19 NOTE — H&P (Signed)
History and Physical    Chief Complaint: Dizziness.    HISTORY OF PRESENT ILLNESS: Lorraine Wyatt is an 76 y.o. female seen today for dizziness and generalized weakness. Per ed pt reported polyuria but pt reports that she has no trouble with urination.  Pt request something for pain in her neck and sleep medication trazodone.  Patient also reports palpitation, that have been chronic. She has seen heart md and was told She has tachycardia.   Pt has PMH of: Past Medical History:  Diagnosis Date   Diabetes mellitus without complication (HCC)    Hyperlipidemia    Hypertension    Parkinson disease (HCC)    Review Of Systems: Review of Systems  Unable to perform ROS: Age    ALLERGIES: Allergies  Allergen Reactions   Clarithromycin    Erythromycin Ethylsuccinate    Trandolapril     PAST SURGICAL HISTORY: Past Surgical History:  Procedure Laterality Date   BREAST CYST ASPIRATION Right    COLONOSCOPY WITH PROPOFOL N/A 12/12/2017   Procedure: COLONOSCOPY WITH PROPOFOL;  Surgeon: Scot Jun, MD;  Location: [facility] ENDOSCOPY;  Service: Endoscopy;  Laterality: N/A;   ESOPHAGOGASTRODUODENOSCOPY (EGD) WITH PROPOFOL N/A 12/12/2017   Procedure: ESOPHAGOGASTRODUODENOSCOPY (EGD) WITH PROPOFOL;  Surgeon: Scot Jun, MD;  Location: [facility] ENDOSCOPY;  Service: Endoscopy;  Laterality: N/A;     SOCIAL HISTORY: Social History   Socioeconomic History   Marital status: Widowed    Spouse name: Not on file   Number of children: Not on file   Years of education: Not on file   Highest education level: Not on file  Occupational History   Not on file  Tobacco Use   Smoking status: Never   Smokeless tobacco: Never  Vaping Use   Vaping Use: Never used  Substance and Sexual Activity   Alcohol use: Not Currently   Drug use: Never   Sexual activity: Not on file  Other Topics Concern   Not on file  Social History Narrative   Not on file   Social Determinants of Health    Financial Resource Strain: Not on file  Food Insecurity: Not on file  Transportation Needs: Not on file  Physical Activity: Not on file  Stress: Not on file  Social Connections: Not on file      CURRENT MEDS:  Current Facility-Administered Medications (Endocrine & Metabolic):    insulin aspart (novoLOG) injection 0-9 Units  Current Outpatient Medications (Endocrine & Metabolic):    glipiZIDE (GLUCOTROL) 5 MG tablet, Take 5 mg by mouth daily before breakfast.    metFORMIN (GLUCOPHAGE) 1000 MG tablet, Take 1,000 mg by mouth 2 (two) times daily with a meal.   sitaGLIPtin (JANUVIA) 100 MG tablet, Take 100 mg by mouth daily.  Current Facility-Administered Medications (Cardiovascular):    carvedilol (COREG) tablet 3.125 mg   hydrALAZINE (APRESOLINE) injection 5 mg  Current Outpatient Medications (Cardiovascular):    carvedilol (COREG) 3.125 MG tablet, Take 3.125 mg by mouth 2 (two) times daily with a meal.   losartan (COZAAR) 25 MG tablet, Take 25 mg by mouth daily.   pravastatin (PRAVACHOL) 40 MG tablet, Take 40 mg by mouth daily.    Current Facility-Administered Medications (Analgesics):    acetaminophen (TYLENOL) tablet 650 mg **OR** acetaminophen (TYLENOL) suppository 650 mg   acetaminophen (TYLENOL) tablet 1,000 mg   Current Facility-Administered Medications (Hematological):    heparin injection 5,000 Units  Current Outpatient Medications (Hematological):    cyanocobalamin (,VITAMIN B-12,) 1000 MCG/ML injection, Inject 1 mL  into the muscle every 30 (thirty) days.  Current Facility-Administered Medications (Other):    0.9 %  sodium chloride infusion   carbidopa-levodopa (SINEMET IR) 25-250 MG per tablet immediate release 1 tablet   pantoprazole (PROTONIX) injection 40 mg   rOPINIRole (REQUIP) tablet 0.25 mg   sodium chloride flush (NS) 0.9 % injection 3 mL  Current Outpatient Medications (Other):    carbidopa-levodopa (SINEMET IR) 25-250 MG tablet, Take 1 tablet by  mouth 3 (three) times daily.   omeprazole (PRILOSEC) 20 MG capsule, Take 20 mg by mouth daily.   rOPINIRole (REQUIP) 0.25 MG tablet, Take 0.25 mg by mouth 3 (three) times daily.    ED Course: Pt in Ed is alert,awake and oriented. Meets SIRS criteria no infection suspected.  > Vitals:   03/19/22 2230 03/19/22 2300 03/19/22 2330 03/20/22 0000  BP: (!) 172/84 (!) 179/82 (!) 100/40 125/72  Pulse: (!) 104 (!) 106 (!) 102 90  Resp: (!) 22 (!) 24 (!) 33 20  Temp: 97.9 F (36.6 C)   98.1 F (36.7 C)  TempSrc:    Oral  SpO2: 100% 99% 96% 98%   >Total I/O In: 1098.9 [IV Piggyback:1098.9] Out: -  >SpO2: 98 % >labs shows hyponatremia and  normal anion gap acidosis with bicarb of 21 and AKI.  Results for orders placed or performed during the hospital encounter of 03/19/22 (from the past 24 hour(s))  Basic metabolic panel     Status: Abnormal   Collection Time: 03/19/22  3:14 PM  Result Value Ref Range   Sodium 127 (L) 135 - 145 mmol/L   Potassium 4.2 3.5 - 5.1 mmol/L   Chloride 94 (L) 98 - 111 mmol/L   CO2 21 (L) 22 - 32 mmol/L   Glucose, Bld 102 (H) 70 - 99 mg/dL   BUN 21 8 - 23 mg/dL   Creatinine, Ser 6.27 (H) 0.44 - 1.00 mg/dL   Calcium 9.3 8.9 - 03.5 mg/dL   GFR, Estimated 57 (L) >60 mL/min   Anion gap 12 5 - 15  CBC     Status: Abnormal   Collection Time: 03/19/22  3:14 PM  Result Value Ref Range   WBC 8.5 4.0 - 10.5 K/uL   RBC 3.98 3.87 - 5.11 MIL/uL   Hemoglobin 10.3 (L) 12.0 - 15.0 g/dL   HCT 00.9 (L) 38.1 - 82.9 %   MCV 80.9 80.0 - 100.0 fL   MCH 25.9 (L) 26.0 - 34.0 pg   MCHC 32.0 30.0 - 36.0 g/dL   RDW 93.7 16.9 - 67.8 %   Platelets 336 150 - 400 K/uL   nRBC 0.0 0.0 - 0.2 %  Urinalysis, Routine w reflex microscopic     Status: Abnormal   Collection Time: 03/19/22  3:14 PM  Result Value Ref Range   Color, Urine YELLOW (A) YELLOW   APPearance CLEAR (A) CLEAR   Specific Gravity, Urine 1.016 1.005 - 1.030   pH 5.0 5.0 - 8.0   Glucose, UA NEGATIVE NEGATIVE mg/dL    Hgb urine dipstick NEGATIVE NEGATIVE   Bilirubin Urine NEGATIVE NEGATIVE   Ketones, ur 5 (A) NEGATIVE mg/dL   Protein, ur 30 (A) NEGATIVE mg/dL   Nitrite NEGATIVE NEGATIVE   Leukocytes,Ua TRACE (A) NEGATIVE   RBC / HPF 0-5 0 - 5 RBC/hpf   WBC, UA 0-5 0 - 5 WBC/hpf   Bacteria, UA RARE (A) NONE SEEN   Squamous Epithelial / LPF 0-5 0 - 5   Mucus PRESENT  Brain natriuretic peptide     Status: None   Collection Time: 03/19/22  3:14 PM  Result Value Ref Range   B Natriuretic Peptide 68.1 0.0 - 100.0 pg/mL  D-dimer, quantitative     Status: None   Collection Time: 03/19/22  6:47 PM  Result Value Ref Range   D-Dimer, Quant 0.35 0.00 - 0.50 ug/mL-FEU  CBG monitoring, ED     Status: None   Collection Time: 03/19/22  8:32 PM  Result Value Ref Range   Glucose-Capillary 90 70 - 99 mg/dL  POC CBG, ED     Status: Abnormal   Collection Time: 03/19/22 10:29 PM  Result Value Ref Range   Glucose-Capillary 159 (H) 70 - 99 mg/dL   Meds ordered this encounter  Medications   losartan (COZAAR) tablet 25 mg   sodium chloride 0.9 % bolus 500 mL   cefTRIAXone (ROCEPHIN) 1 g in sodium chloride 0.9 % 100 mL IVPB    Order Specific Question:   Antibiotic Indication:    Answer:   UTI   sodium chloride tablet 1 g   ondansetron (ZOFRAN) injection 4 mg   sodium chloride 0.9 % bolus 500 mL   acetaminophen (TYLENOL) tablet 1,000 mg   carbidopa-levodopa (SINEMET IR) 25-250 MG per tablet immediate release 1 tablet   carvedilol (COREG) tablet 3.125 mg   rOPINIRole (REQUIP) tablet 0.25 mg   heparin injection 5,000 Units   sodium chloride flush (NS) 0.9 % injection 3 mL   0.9 %  sodium chloride infusion   OR Linked Order Group    acetaminophen (TYLENOL) tablet 650 mg    acetaminophen (TYLENOL) suppository 650 mg   hydrALAZINE (APRESOLINE) injection 5 mg   pantoprazole (PROTONIX) injection 40 mg   insulin aspart (novoLOG) injection 0-9 Units    Order Specific Question:   Correction coverage:    Answer:    Sensitive (thin, NPO, renal)    Order Specific Question:   CBG < 70:    Answer:   Implement Hypoglycemia Standing Orders and refer to Hypoglycemia Standing Orders sidebar report    Order Specific Question:   CBG 70 - 120:    Answer:   0 units    Order Specific Question:   CBG 121 - 150:    Answer:   1 unit    Order Specific Question:   CBG 151 - 200:    Answer:   2 units    Order Specific Question:   CBG 201 - 250:    Answer:   3 units    Order Specific Question:   CBG 251 - 300:    Answer:   5 units    Order Specific Question:   CBG 301 - 350:    Answer:   7 units    Order Specific Question:   CBG 351 - 400    Answer:   9 units    Order Specific Question:   CBG > 400    Answer:   call MD and obtain STAT lab verification   Admission Imaging : DG Chest Port 1 View  Result Date: 03/19/2022 CLINICAL DATA:  Dizziness EXAM: PORTABLE CHEST 1 VIEW COMPARISON:  None Available. FINDINGS: Transverse diameter of heart is slightly increased. There are no signs of pulmonary edema or focal pulmonary consolidation. There is no pleural effusion or pneumothorax. [name] chin is partially obscuring the left apex. IMPRESSION: No active disease. Electronically Signed   By: Ernie Avena M.D.   On: 03/19/2022 18:36  Physical Examination: Vitals:   03/19/22 2230 03/19/22 2300 03/19/22 2330 03/20/22 0000  BP: (!) 172/84 (!) 179/82 (!) 100/40 125/72  Pulse: (!) 104 (!) 106 (!) 102 90  Temp: 97.9 F (36.6 C)   98.1 F (36.7 C)  Resp: (!) 22 (!) 24 (!) 33 20  SpO2: 100% 99% 96% 98%  TempSrc:    Oral   Physical Exam Vitals and nursing note reviewed.  Constitutional:      General: She is not in acute distress.    Appearance: She is not ill-appearing, toxic-appearing or diaphoretic.  HENT:     Head: Normocephalic and atraumatic.     Right Ear: Hearing and external ear normal.     Left Ear: Hearing and external ear normal.     Nose: Nose normal. No nasal deformity.     Mouth/Throat:      Lips: Pink.     Mouth: Mucous membranes are dry.     Tongue: No lesions.  Eyes:     Extraocular Movements: Extraocular movements intact.     Pupils: Pupils are equal, round, and reactive to light.  Cardiovascular:     Rate and Rhythm: Regular rhythm. Tachycardia present.     Pulses: Normal pulses.     Heart sounds: Normal heart sounds.  Pulmonary:     Effort: Pulmonary effort is normal.     Breath sounds: Normal breath sounds.  Abdominal:     General: Bowel sounds are normal. There is no distension.     Palpations: Abdomen is soft. There is no mass.     Tenderness: There is no abdominal tenderness. There is no guarding.     Hernia: No hernia is present.  Musculoskeletal:     Right lower leg: No edema.     Left lower leg: No edema.  Skin:    General: Skin is warm.  Neurological:     General: No focal deficit present.     Mental Status: She is alert and oriented to person, place, and time.     Cranial Nerves: Cranial nerves 2-12 are intact.     Motor: Motor function is intact.  Psychiatric:        Attention and Perception: Attention normal.        Mood and Affect: Mood normal.        Speech: Speech normal.        Behavior: Behavior normal.    Assessment and Plan: * Dizziness Attribute patient's symptoms of dizziness and generalized weakness to dehydration as reflected by hyponatremia and hypochloremia and mild acute kidney injury. Vitals:   03/19/22 1511 03/19/22 1753 03/19/22 1800 03/19/22 1900  BP: (!) 166/117 (!) 190/92 (!) 199/105 (!) 247/167   03/19/22 1930 03/19/22 2030 03/19/22 2200 03/19/22 2230  BP: (!) 195/169 107/77 (!) 159/102 (!) 172/84  Suspect patient may actually be orthostatic and hypotensive giving rise to tachycardia and we will check orthostatic vitals. Fall precautions. Patient is nonfocal on her neuro exam.  She is ambulatory with a rollator at baseline and lives alone and is able to care for herself.   Hyponatremia    Latest Ref Rng & Units  03/19/2022    3:14 PM 03/29/2021    8:08 AM 03/28/2021    5:10 AM  BMP  Glucose 70 - 99 mg/dL 409102  [id number]  [id number]   BUN 8 - 23 mg/dL 21  13  17    Creatinine 0.44 - 1.00 mg/dL 7.821.02  9.560.95  2.130.93   Sodium 135 -  145 mmol/L 127  129  130   Potassium 3.5 - 5.1 mmol/L 4.2  4.0  3.0   Chloride 98 - 111 mmol/L 94  94  95   CO2 22 - 32 mmol/L Calcium 8.9 - 10.3 mg/dL 9.3  9.1  8.1    Patient has history of chronic hyponatremia and has a history of diuretic use which she is not on any longer which I suspect is secondary to her hyponatremia presentation. Gentle hydration overnight with normal saline.   Essential hypertension Vitals:   03/19/22 1511 03/19/22 1753 03/19/22 1800 03/19/22 1900  BP: (!) 166/117 (!) 190/92 (!) 199/105 (!) 247/167   03/19/22 1930 03/19/22 2030 03/19/22 2200 03/19/22 2230  BP: (!) 195/169 107/77 (!) 159/102 (!) 172/84  Currently we will continue patient on Coreg and as needed hydralazine. We will resume patient's losartan after we have orthostatic vitals. 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    Type 2 diabetes mellitus (HCC) Do not suspect patient is a diabetic. We will weigh patient. Would recommend discontinuation of metformin in her case due to Risk of lactic acidosis which may also be contributing to her sinus tachycardia. We will check patient's lactic acid level. SSI regimen and a1c.   Anemia D/d include iron deficiency anemia and anemia of chronic disease.  07/18/18 13:50 05/03/19 17:26 03/27/21 15:19 03/27/21 20:56 03/28/21 05:10 03/29/21 08:08 03/19/22 15:14  Hemoglobin 11.6 (L) 12.1 11.0 (L) 10.6 (L) 9.9 (L) 11.6 (L) 10.3 (L)  We will follow and will defer to pcp for anemia eval as it may be contributing to her chronic tachycardia.   Sinus tachycardia Sinus tach at 110 on ekg.  Suspect underlying lactic acidosis.  Also  check tsh and ft4.  Also cont coreg.  Correct electrolytes.    Parkinson's disease (HCC) Pt continued on Sinemet.   TIA (transient ischemic attack) Pt continued on coreg and PRN hydralazine.    Unresulted Labs (From admission, onward)     Start     Ordered   03/20/22 0500  Comprehensive metabolic panel  Tomorrow morning,   STAT        03/19/22 2309   03/20/22 0500  CBC  Tomorrow morning,   STAT        03/19/22 2309   03/19/22 2305  Hemoglobin A1c  Add-on,   AD        03/19/22 2309   03/19/22 2300  Lactic acid, plasma  STAT Now then every 3 hours,   STAT      03/19/22 2259   03/19/22 1820  Blood culture (routine x 2)  BLOOD CULTURE X 2,   STAT      03/19/22 1819            DVT prophylaxis:  Heparin q12H  Code Status:  Full code    Family Communication:  IVRY, PIGUE (Son)  [phone] (Home Phone   Disposition Plan:  HOME  Consults called:  None   Admission status: Observation.    Unit/ Expected LOS: Med tele/ 1 day.    Gertha Calkin MD [facility]  6 PM- 2 AM. Please contact me via secure Chat 6 PM-2 AM. [phone] ( Pager ) To contact the [facility] Attending or Consulting provider 7A - 7P or covering provider during after hours 7P -7A, for this patient.  Check the care team in [facility] and look for a) attending/consulting [facility] provider listed and b) the [facility] team listed Log into [email or web address] and use [facility] universal password to access. If you do not have the password, please contact the hospital operator. Locate the [facility] provider you are looking for under [facility] and page to a number that you can be directly reached. If you still have difficulty reaching the provider, please page the [facility] (Director on Call) for the Hospitalists listed on amion for assistance. [email or web address] 03/20/2022, 12:12 AM

## 2022-03-19 NOTE — Assessment & Plan Note (Signed)
Pt continued on coreg and PRN hydralazine.

## 2022-03-19 NOTE — Assessment & Plan Note (Signed)
    Latest Ref Rng & Units 03/19/2022    3:14 PM 03/29/2021    8:08 AM 03/28/2021    5:10 AM  BMP  Glucose 70 - 99 mg/dL 242  353  614   BUN 8 - 23 mg/dL 21  13  17    Creatinine 0.44 - 1.00 mg/dL  4.31  5.40   Sodium 135 - 145 mmol/L 127  129  130   Potassium 3.5 - 5.1 mmol/L 4.2  4.0  3.0   Chloride 98 - 111 mmol/L 94  94  95   CO2 22 - 32 mmol/L 21  22  25    Calcium 8.9 - 10.3 mg/dL 9.3  9.1  8.1    Patient has history of chronic hyponatremia and has a history of diuretic use which she is not on any longer which I suspect is secondary to her hyponatremia presentation. Gentle hydration overnight with normal saline.

## 2022-03-19 NOTE — ED Triage Notes (Signed)
First Nurse: Pt here via [facility] from home. Pt states she does not feel like herself today. Pt states dizziness with movement. Pt states she has not urinated in 24 hrs but also has not been eating or drinking. Pt states she took her bp meds today.   208/118 118 97% RA 102-cbg

## 2022-03-19 NOTE — Assessment & Plan Note (Signed)
Do not suspect patient is a diabetic. We will weigh patient. Would recommend discontinuation of metformin in her case due to Risk of lactic acidosis which may also be contributing to her sinus tachycardia. We will check patient's lactic acid level. SSI regimen and a1c.

## 2022-03-19 NOTE — Assessment & Plan Note (Signed)
Sinus tach at 110 on ekg.  Suspect underlying lactic acidosis.  Also check tsh and ft4.  Also cont coreg.  Correct electrolytes.

## 2022-03-19 NOTE — ED Notes (Signed)
Moved BP cuff to left calf area.  BP WNL.  Family going to get pt [employer] for supper and her rolling walker.

## 2022-03-19 NOTE — Assessment & Plan Note (Addendum)
Attribute patient's symptoms of dizziness and generalized weakness to dehydration as reflected by hyponatremia and hypochloremia and mild acute kidney injury. Vitals:   03/19/22 1511 03/19/22 1753 03/19/22 1800 03/19/22 1900  BP: (!) 166/117 (!) 190/92 (!) 199/105 (!) 247/167   03/19/22 1930 03/19/22 2030 03/19/22 2200 03/19/22 2230  BP: (!) 195/169 107/77 (!) 159/102 (!) 172/84  Suspect patient may actually be orthostatic and hypotensive giving rise to tachycardia and we will check orthostatic vitals. Fall precautions. Patient is nonfocal on her neuro exam.  She is ambulatory with a rollator at baseline and lives alone and is able to care for herself.

## 2022-03-19 NOTE — ED Provider Notes (Signed)
[facility] Provider Note    Event Date/Time   First MD Initiated Contact with Patient 03/19/22 1749     (approximate)   History   Dizziness   HPI  Lorraine Wyatt is a 76 y.o. female who on review of previous discharge summary from September of last year has a history of diabetes hypertension hyperlipidemia and Parkinson's.  She is admitted previously with concerns of hyponatremia generalized weakness urinary retention at that time she was improved after being treated with IV fluids.  Also treated presumptively for possible urinary tract infection.   For the last roughly day patient has not quite felt herself.  She reports she has been feeling fatigued, and also having discomfort and increased urinary frequency for about a day.  She reports suspicious she may have urinary tract infection.  No headaches no chest pain no cough.  No trouble breathing except for chronic feeling of shortness of breath which she reports she has lived with for 2 years.  No worsening.  Some slight body aches.  No weakness in 1 arm or leg or difficulty speaking or headache.  She reports rashes feels like her whole body is very fatigued, has had issues with sodium as well with similar in the past  She reports drinking 4-5 bottles of water at least daily.  No diarrhea no nausea vomiting no abdominal pain.  No chest pain.   Physical Exam   Triage Vital Signs: ED Triage Vitals [03/19/22 1511]  Enc Vitals Group     BP (!) 166/117     Pulse Rate (!) 112     Resp 18     Temp 97.9 F (36.6 C)     Temp Source Oral     SpO2 99 %     Weight      Height      Head Circumference      Peak Flow      Pain Score 0     Pain Loc      Pain Edu?      Excl. in GC?     Most recent vital signs: Vitals:   03/19/22 2200 03/19/22 2230  BP: (!) 159/102 (!) 172/84  Pulse: (!) 114 (!) 104  Resp: 19 (!) 22  Temp:  97.9 F (36.6 C)  SpO2: 99% 100%     General: Awake, no distress.  She is  pleasant but appears fatigued. CV:  Good peripheral perfusion.  Slightly tachycardic, otherwise normal heart tones without murmur rub.  Well-perfused extremities Resp:  Normal effort.  Clear bilaterally.  Normal work of breathing.  No respiratory distress Abd:  No distention.  Soft nontender nondistended throughout Other:    Denies difficulty emptying her bladder but reports she is having to go frequently and some discomfort  ED Results / Procedures / Treatments   Labs (all labs ordered are listed, but only abnormal results are displayed) Labs Reviewed  BASIC METABOLIC PANEL - Abnormal; Notable for the following components:      Result Value   Sodium 127 (*)    Chloride 94 (*)    CO2 21 (*)    Glucose, Bld 102 (*)    Creatinine, Ser 1.02 (*)    GFR, Estimated 57 (*)    All other components within normal limits  CBC - Abnormal; Notable for the following components:   Hemoglobin 10.3 (*)    HCT 32.2 (*)    MCH 25.9 (*)    All other components  within normal limits  URINALYSIS, ROUTINE W REFLEX MICROSCOPIC - Abnormal; Notable for the following components:   Color, Urine YELLOW (*)    APPearance CLEAR (*)    Ketones, ur 5 (*)    Protein, ur 30 (*)    Leukocytes,Ua TRACE (*)    Bacteria, UA RARE (*)    All other components within normal limits  CULTURE, BLOOD (ROUTINE X 2)  CULTURE, BLOOD (ROUTINE X 2)  BRAIN NATRIURETIC PEPTIDE  D-DIMER, QUANTITATIVE (NOT AT [facility])  CBG MONITORING, ED  CBG MONITORING, ED   Labs reviewed, suspicious for possible urinary tract infection.  Leukocytes are present and rare bacteria, and in the setting of symptomatology suspect UTI may be present and will treat with Rocephin.  Labs reveal hyponatremia, mild to moderate, but also appears to have an element of chronicity.  EKG  And interpreted by me at 1756 Heart rate 110 QRS 90 QTc 430 Sinus tachycardia, no evidence of acute ischemia.   RADIOLOGY  Chest x-ray interpreted by me as  normal   PROCEDURES:  Critical Care performed: No  Procedures   MEDICATIONS ORDERED IN ED: Medications  losartan (COZAAR) tablet 25 mg (25 mg Oral Given 03/19/22 1908)  sodium chloride 0.9 % bolus 500 mL (0 mLs Intravenous Stopped 03/19/22 1943)  cefTRIAXone (ROCEPHIN) 1 g in sodium chloride 0.9 % 100 mL IVPB (0 g Intravenous Stopped 03/19/22 1941)  sodium chloride tablet 1 g (1 g Oral Given 03/19/22 2004)  ondansetron (ZOFRAN) injection 4 mg (4 mg Intravenous Given 03/19/22 2230)  sodium chloride 0.9 % bolus 500 mL (500 mLs Intravenous New Bag/Given 03/19/22 2228)     IMPRESSION / MDM / ASSESSMENT AND PLAN / ED COURSE  I reviewed the triage vital signs and the nursing notes.                              Differential diagnosis includes, but is not limited to, urinary tract infection, possible dehydration, evaluate for infectious and acute metabolic causes.  Also noted is hyponatremia thought to be due to dehydration in the past, and evidently improved with salt tablets and hydration.  Provide IV fluids, salt tablet, and also she does report frequent urination and noted to have slightly dry mucous membranes suspicious for mild prerenal etiology of slight dehydration.  No associated confusion.  Reports just feeling of generalized fatigue but no obvious frank muscle weakness on exam no central neurologic symptoms no acute cardiopulmonary symptoms.  She does have notable significant hypertension on presentation, and reports recently down titrated her home blood pressure medicine.  We will give her some additional blood pressure medicine continue to follow, and also follow heart rate and hydrate.  Given tachycardia and her complaint of chronic dyspnea, D-dimer performed with low pretest probability for PE and this is normal  [name] presentation is most consistent with acute complicated illness / injury requiring diagnostic workup.  The patient is on the cardiac monitor to evaluate for  evidence of arrhythmia and/or significant heart rate changes.    ----------------------------------------- 10:30 PM on 03/19/2022 -----------------------------------------  Vitals:   03/19/22 2200 03/19/22 2230  BP: (!) 159/102 (!) 172/84  Pulse: (!) 114 (!) 104  Resp: 19 (!) 22  Temp:  97.9 F (36.6 C)  SpO2: 99% 100%     Vital signs improving.  Patient did feel better after receiving fluids and initiated antibiotic, but after about 30 minutes started feeling very fatigued generally weak  and slightly nauseated.  She reports that she is starting to feel very weak once again, at this point we will give additional IV fluid discussed with patient and given the notable weakness and hyponatremia I recommended she stay for admission.  Patient was initially hesitant earlier, but having trialed fluids, ambulation and now feeling like symptoms are recurring she is agreeable with plan for admission  I have consulted with the hospitalist, and will be admitted to the service of Dr. Renaldo Reel  FINAL CLINICAL IMPRESSION(S) / ED DIAGNOSES   Final diagnoses:  Dehydration  Lightheadedness  Lower urinary tract infectious disease  Hyponatremia     Rx / DC Orders   ED Discharge Orders     None        Note:  This document was prepared using Dragon voice recognition software and may include unintentional dictation errors.   Sharyn Creamer, MD 03/19/22 2248

## 2022-03-19 NOTE — ED Notes (Signed)
Pt has tremmors unable to get ekg

## 2022-03-19 NOTE — ED Notes (Signed)
Assisted to [facility] to void and back.

## 2022-03-20 ENCOUNTER — Observation Stay: Payer: Medicare Other

## 2022-03-20 DIAGNOSIS — R42 Dizziness and giddiness: Secondary | ICD-10-CM | POA: Diagnosis not present

## 2022-03-20 DIAGNOSIS — D649 Anemia, unspecified: Secondary | ICD-10-CM | POA: Diagnosis present

## 2022-03-20 LAB — COMPREHENSIVE METABOLIC PANEL
ALT: <5 U/L (ref 0–44)
AST: 17 U/L (ref 15–41)
Albumin: 3.7 g/dL (ref 3.5–5.0)
Alkaline Phosphatase: 45 U/L (ref 38–126)
Anion gap: 10 (ref 5–15)
BUN: 16 mg/dL (ref 8–23)
CO2: 19 mmol/L — ABNORMAL LOW (ref 22–32)
Calcium: 8.9 mg/dL (ref 8.9–10.3)
Chloride: 100 mmol/L (ref 98–111)
Creatinine, Ser: 0.92 mg/dL (ref 0.44–1.00)
GFR, Estimated: >60 mL/min (ref >60)
Glucose, Bld: 162 mg/dL — ABNORMAL HIGH (ref 70–99)
Potassium: 3.7 mmol/L (ref 3.5–5.1)
Sodium: 129 mmol/L — ABNORMAL LOW (ref 135–145)
Total Bilirubin: 0.7 mg/dL (ref 0.3–1.2)
Total Protein: 6.2 g/dL — ABNORMAL LOW (ref 6.5–8.1)

## 2022-03-20 LAB — OSMOLALITY: Osmolality: 271 mOsm/kg — ABNORMAL LOW (ref 275–295)

## 2022-03-20 LAB — CBC
HCT: 29.9 % — ABNORMAL LOW (ref 36.0–46.0)
Hemoglobin: 9.7 g/dL — ABNORMAL LOW (ref 12.0–15.0)
MCH: 25.7 pg — ABNORMAL LOW (ref 26.0–34.0)
MCHC: 32.4 g/dL (ref 30.0–36.0)
MCV: 79.3 fL — ABNORMAL LOW (ref 80.0–100.0)
Platelets: 301 10*3/uL (ref 150–400)
RBC: 3.77 MIL/uL — ABNORMAL LOW (ref 3.87–5.11)
RDW: 13.7 % (ref 11.5–15.5)
WBC: 5.7 10*3/uL (ref 4.0–10.5)
nRBC: 0 % (ref 0.0–0.2)

## 2022-03-20 LAB — GLUCOSE, CAPILLARY
Glucose-Capillary: 139 mg/dL — ABNORMAL HIGH (ref 70–99)
Glucose-Capillary: 136 mg/dL — ABNORMAL HIGH (ref 70–99)

## 2022-03-20 LAB — LACTIC ACID, PLASMA
Lactic Acid, Venous: 0.8 mmol/L (ref 0.5–1.9)
Lactic Acid, Venous: 1.1 mmol/L (ref 0.5–1.9)

## 2022-03-20 LAB — OSMOLALITY, URINE: Osmolality, Ur: 452 mOsm/kg (ref 300–900)

## 2022-03-20 LAB — HEMOGLOBIN A1C
Hgb A1c MFr Bld: 6.1 % — ABNORMAL HIGH (ref 4.8–5.6)
Mean Plasma Glucose: 128.37 mg/dL

## 2022-03-20 LAB — TSH: TSH: 1.397 u[IU]/mL (ref 0.350–4.500)

## 2022-03-20 LAB — SODIUM, URINE, RANDOM: Sodium, Ur: 73 mmol/L

## 2022-03-20 MED ORDER — CARBIDOPA-LEVODOPA 25-250 MG PO TABS
1.0000 | ORAL_TABLET | Freq: Every day | ORAL | Status: DC
  Administered 2022-03-20 (×3): 1 via ORAL
  Filled 2022-03-20 (×3): qty 1

## 2022-03-20 MED ORDER — PANTOPRAZOLE SODIUM 40 MG PO TBEC
40.0000 mg | DELAYED_RELEASE_TABLET | Freq: Two times a day (BID) | ORAL | Status: DC
  Administered 2022-03-20: 40 mg via ORAL
  Filled 2022-03-20: qty 1

## 2022-03-20 MED ORDER — TRAZODONE HCL 50 MG PO TABS
50.0000 mg | ORAL_TABLET | Freq: Every day | ORAL | Status: AC
  Administered 2022-03-20: 50 mg via ORAL
  Filled 2022-03-20: qty 1

## 2022-03-20 NOTE — Progress Notes (Signed)
PHARMACIST - PHYSICIAN COMMUNICATION  CONCERNING: IV to Oral Route Change Policy  RECOMMENDATION: This patient is receiving pantoprazole by the intravenous route.  Based on criteria approved by the Pharmacy and Therapeutics Committee, the intravenous medication(s) is/are being converted to the equivalent oral dose form(s).   DESCRIPTION: These criteria include: The patient is eating (either orally or via tube) and/or has been taking other orally administered medications for a least 24 hours The patient has no evidence of active gastrointestinal bleeding or impaired GI absorption (gastrectomy, short bowel, patient on TNA or NPO).  If you have questions about this conversion, please contact the [place] 03/20/2022 9:07 AM

## 2022-03-20 NOTE — Progress Notes (Signed)
Cross Cover On unit admission patient informed nurse that she wanted to be a DNR Confirmed with patient at bedside that she would only want to be made comfortable.  Order changed in computer. Son to bring in copy of living will  Ottis Stain NP [facility]

## 2022-03-20 NOTE — TOC Transition Note (Addendum)
Transition of Care [facility]) - CM/SW Discharge Note   Patient Details  Name: Lorraine Wyatt MRN: [id number] Date of Birth: Jun 28, 1946  [facility]) CM/SW Contact:  Harriet Masson, RN Phone [phone] 03/20/2022, 4:08 PM   Clinical Narrative:    Spoke with pt concerning recommendations for HHPT as pt receptive to [facility] for services. Referral called to [facility] who accepted [facility] for [facility]. Pt has a rollator and declined any of DME at this time with no additional needs. Pt lives alone and has sufficient transportation and ability to obtain all her medications upon discharged from [facility]. Also provided resources for medical alert agencies for EMS alerts or services if needed to son at bedside. Pt indicated she would get him to research the Internet for more agencies of choice. No other needs presented at this time.  TOC remains available if any ongoing needs.   Final next level of care: [place] Barriers to Discharge: Barriers Resolved   Patient Goals and CMS Choice        Discharge Placement                  Name of family member notified: spoke w/ pt and son [name] at bedside Patient and family notified of of transfer: 03/20/22  Discharge Plan and Services                          HH Arranged: PT [facility] Agency: [place] [facility] Date HH Agency Contacted: 03/20/22 Time [place]: [place] Representative spoke with at [place]: Corene Cornea  Social Determinants of Health (SDOH) Interventions Food Insecurity Interventions: Intervention Not Indicated Housing Interventions: Intervention Not Indicated Transportation Interventions: Intervention Not Indicated Utilities Interventions: Intervention Not Indicated   Readmission Risk Interventions     No data to display

## 2022-03-20 NOTE — Discharge Summary (Signed)
Lorraine Wyatt SHF:[id number] DOB: March 19, 1946 DOA: 03/19/2022  PCP: Jerl Mina, MD  Admit date: 03/19/2022 Discharge date: 03/20/2022  Time spent: 35 minutes  Recommendations for Outpatient Follow-up:  Pcp and neurology f/u     Discharge Diagnoses:  Principal Problem:   Dizziness Active Problems:   Hyponatremia   Essential hypertension   Type 2 diabetes mellitus (HCC)   TIA (transient ischemic attack)   Parkinson's disease (HCC)   Sinus tachycardia   Anemia   Discharge Condition: stable  Diet recommendation: heart healthy  Filed Weights   03/20/22 0025  Weight: 56 kg    History of present illness:  From admission h and p by dr. Allena Katz: Lorraine Wyatt is an 76 y.o. female seen today for dizziness and generalized weakness. Per ed pt reported polyuria but pt reports that she has no trouble with urination.  Pt request something for pain in her neck and sleep medication trazodone.  Patient also reports palpitation, that have been chronic. She has seen heart md and was told She has tachycardia.   Hospital Course:  Patient presented with lightheadedness (no syncope) and a sensation of generalized weakness. CT head negative. Chronic hyponatremia with sodium of around 130 unchanged from baseline. Ambulated with PT who advises [facility] PT/OT. No other significant lab abnormalities. No signs infection. Orthostats negative but suspect a degree of orthostasis. No sig findings on ekg or tele monitoring. Trazodone was started relatively recently so advise discontinuing that. [name] age and parkinson's disease likely the main play but so too are several of her meds including sinimet, requip, metop, and losartan. W/u of hyponatremia reveals likely siadh though to complete w/u would need assessment to r/o glucocorticoid deficiency.   Procedures: none   Consultations: none  Discharge Exam: Vitals:   03/20/22 1300 03/20/22 1304  BP: 102/87 108/85  Pulse: 68 65  Resp: 20 20  Temp:  98.7 F (37.1 C)   SpO2: 100% 100%    General: NAD Cardiovascular: RRR Respiratory: CTAB  Discharge Instructions   Discharge Instructions     Diet - low sodium heart healthy   Complete by: As directed    Increase activity slowly   Complete by: As directed       Allergies as of 03/20/2022       Reactions   Clarithromycin    Erythromycin Ethylsuccinate    Trandolapril         Medication List     STOP taking these medications    traZODone 50 MG tablet Commonly known as: DESYREL       TAKE these medications    carbidopa-levodopa 25-250 MG tablet Commonly known as: SINEMET IR Take 1 tablet by mouth 3 (three) times daily. What changed:  when to take this additional instructions   carvedilol 3.125 MG tablet Commonly known as: COREG Take 3.125 mg by mouth 2 (two) times daily with a meal.   cyanocobalamin 1000 MCG/ML injection Commonly known as: VITAMIN B12 Inject 1 mL into the muscle every 30 (thirty) days.   glipiZIDE 5 MG tablet Commonly known as: GLUCOTROL Take 5 mg by mouth daily before breakfast.   losartan 25 MG tablet Commonly known as: COZAAR Take 25 mg by mouth daily.   metFORMIN 1000 MG tablet Commonly known as: GLUCOPHAGE Take 1,000 mg by mouth 2 (two) times daily with a meal.   omeprazole 20 MG capsule Commonly known as: PRILOSEC Take 20 mg by mouth daily.   pravastatin 40 MG tablet Commonly known as: PRAVACHOL Take  40 mg by mouth daily.   rOPINIRole 0.25 MG tablet Commonly known as: REQUIP Take 0.25 mg by mouth 3 (three) times daily.   sitaGLIPtin 100 MG tablet Commonly known as: JANUVIA Take 100 mg by mouth daily.       Allergies  Allergen Reactions   Clarithromycin    Erythromycin Ethylsuccinate    Trandolapril     Follow-up Information     Jerl Mina, MD Follow up.   Specialty: Family Medicine Contact information: [address] [facility] [place] [address] [phone]                   The results of significant diagnostics from this hospitalization (including imaging, microbiology, ancillary and laboratory) are listed below for reference.    Significant Diagnostic Studies: CT HEAD WO CONTRAST ( )  Result Date: 03/20/2022 CLINICAL DATA:  Dizziness weakness lightheaded.  Parkinson's disease EXAM: CT HEAD WITHOUT CONTRAST TECHNIQUE: Contiguous axial images were obtained from the base of the skull through the vertex without intravenous contrast. RADIATION DOSE REDUCTION: This exam was performed according to the departmental dose-optimization program which includes automated exposure control, adjustment of the mA and/or kV according to patient size and/or use of iterative reconstruction technique. COMPARISON:  CT head 07/18/2018 FINDINGS: Brain: No evidence of acute infarction, hemorrhage, hydrocephalus, extra-axial collection or mass lesion/mass effect. Minimal periventricular white matter hypodensity. Vascular: Negative for hyperdense vessel Skull: Negative Sinuses/Orbits: Mild mucosal edema maxillary sinus bilaterally. Bilateral cataract extraction Other: None IMPRESSION: No acute abnormality. Electronically Signed   By: Marlan Palau M.D.   On: 03/20/2022 15:08   DG Chest Port 1 View  Result Date: 03/19/2022 CLINICAL DATA:  Dizziness EXAM: PORTABLE CHEST 1 VIEW COMPARISON:  None Available. FINDINGS: Transverse diameter of heart is slightly increased. There are no signs of pulmonary edema or focal pulmonary consolidation. There is no pleural effusion or pneumothorax. [name] chin is partially obscuring the left apex. IMPRESSION: No active disease. Electronically Signed   By: Ernie Avena M.D.   On: 03/19/2022 18:36    Microbiology: Recent Results (from the past 240 hour(s))  Blood culture (routine x 2)     Status: None (Preliminary result)   Collection Time: 03/19/22  6:47 PM   Specimen: Right Antecubital; Blood  Result Value Ref Range Status   Specimen  Description RIGHT ANTECUBITAL  Final   Special Requests   Final    BOTTLES DRAWN AEROBIC AND ANAEROBIC Blood Culture adequate volume   Culture   Final    NO GROWTH < 12 HOURS Performed at [facility], [address] [place], [place] [address]    Report Status PENDING  Incomplete  Blood culture (routine x 2)     Status: None (Preliminary result)   Collection Time: 03/19/22  6:47 PM   Specimen: BLOOD LEFT ARM  Result Value Ref Range Status   Specimen Description BLOOD LEFT ARM  Final   Special Requests   Final    BOTTLES DRAWN AEROBIC AND ANAEROBIC Blood Culture adequate volume   Culture   Final    NO GROWTH < 12 HOURS Performed at [facility], [address] [place], [place] [address]    Report Status PENDING  Incomplete     Labs: Basic Metabolic Panel: Recent Labs  Lab 03/19/22 1514 03/20/22 0619  NA 127* 129*  K 4.2 3.7  CL 94* 100  CO2 21* 19*  GLUCOSE 102* 162*  BUN 21 16  CREATININE 1.02* 0.92  CALCIUM  9.3 8.9   Liver Function Tests: Recent Labs  Lab 03/20/22 0619  AST 17  ALT <5  ALKPHOS 45  BILITOT 0.7  PROT 6.2*  ALBUMIN 3.7   No results for input(s): "LIPASE", "AMYLASE" in the last 168 hours. No results for input(s): "AMMONIA" in the last 168 hours. CBC: Recent Labs  Lab 03/19/22 1514 03/20/22 0619  WBC 8.5 5.7  HGB 10.3* 9.7*  HCT 32.2* 29.9*  MCV 80.9 79.3*  PLT 336 301   Cardiac Enzymes: No results for input(s): "CKTOTAL", "CKMB", "CKMBINDEX", "TROPONINI" in the last 168 hours. BNP: BNP (last 3 results) Recent Labs    03/19/22 1514  BNP 68.1    ProBNP (last 3 results) No results for input(s): "PROBNP" in the last 8760 hours.  CBG: Recent Labs  Lab 03/19/22 2032 03/19/22 2229 03/20/22 0818 03/20/22 1220  GLUCAP 90 159* 139* 136*       Signed:  Desma Maxim MD.  [facility] 03/20/2022, 4:19 PM

## 2022-03-20 NOTE — Progress Notes (Signed)
Patient transferred to wheelchair with CGA.  Patient transported to CT via wheelchair with IV saline locked in stable condition.

## 2022-03-20 NOTE — Evaluation (Signed)
Physical Therapy Evaluation Patient Details Name: Lorraine Wyatt MRN: [id number] DOB: Feb 21, 1946 Today's Date: 03/20/2022  History of Present Illness  As per  EMR: DONDREA CAVINESS is an 76 y.o. female seen  for dizziness and generalized weakness.  Per ed pt reported polyuria but pt reports that she has no trouble with urination.   Pt request something for pain in her neck and sleep medication trazodone.   Patient also reports palpitation, that have been chronic. She has seen heart md and was told  She has tachycardia. PMHx:  Date   Diabetes mellitus without complication ([place])     Hyperlipidemia     Hypertension     Parkinson disease.  Clinical Impression  Pt received in chair with family by her side and agreeable to participate in PT evaluation. Pt A and O x 4. Pt PLOF Independent with ADLS and IADls. Ind  ambulatory at household level and with one person at community level activity participation. Pt has strong family support.  Today's assessment reveals pt has generalized weakness L>R. Pt ambulated around the Nursing unit with sup of using RW with mild unsteady gait. Pt unable to perform SLS, tandem stand and Rhomberg and therefore, pt educated to use RW at all time to reduce fall risk. Pt demonstrated good understanding. Pt adivised to seek Out- Pt rehab for parkinson after home health if MD OK. Pt will benefit from HHPT to address safe functional mobility at household level and community level after acute care but does not need further PT in acute care setting. Pt will also benefit form med alert. Pt discharged from Physical therapy.      Recommendations for follow up therapy are one component of a multi-disciplinary discharge planning process, led by the attending physician.  Recommendations may be updated based on patient status, additional functional criteria and insurance authorization.  Follow Up Recommendations Home health PT      Assistance Recommended at Discharge Intermittent  Supervision/Assistance  Patient can return home with the following  Assistance with cooking/housework;Assistance with feeding;Assist for transportation    Equipment Recommendations Rolling walker (2 wheels)  Recommendations for Other Services       Functional Status Assessment Patient has not had a recent decline in their functional status     Precautions / Restrictions Precautions Precautions: Fall Restrictions Weight Bearing Restrictions: No      Mobility  Bed Mobility Overal bed mobility:  (pt received in chair)                  Transfers Overall transfer level: Modified independent Equipment used: Rollator (4 wheels) Transfers: Sit to/from Stand Sit to Stand: Modified independent (Device/Increase time)           General transfer comment: Pt uses chair lift    Ambulation/Gait Ambulation/Gait assistance: Supervision Gait Distance (Feet): 160 Feet Assistive device: Rollator (4 wheels) Gait Pattern/deviations: Step-to pattern, Trunk flexed     Pre-gait activities: unsteadiness without LOB.    Stairs            Wheelchair Mobility    Modified Rankin (Stroke Patients Only)       Balance Overall balance assessment: Modified Independent (with Rolator walker. Depends heavily on RW.)                               Standardized Balance Assessment Standardized Balance Assessment :  (5 x STS 8 secs.= good strength. Poor balance without PR  as pt uable ot perform rhomberg, tandem and SLS.)           Pertinent Vitals/Pain Pain Assessment Pain Assessment: No/denies pain    Home Living Family/patient expects to be discharged to:: Private residence Living Arrangements: Alone Available Help at Discharge: Available PRN/intermittently Type of Home: House Home Access: Level entry       Home Layout: One level Home Equipment: Rollator (4 wheels);Cane - single point;BSC/3in1;Shower seat;Toilet riser;Grab bars - toilet;Grab bars -  tub/shower      Prior Function Prior Level of Function : Independent/Modified Independent             Mobility Comments: Independent with household level activity  using rolator walker participation and goes out with grand daughter 3 x week for a meal. ADLs Comments: Ind with bathing, light cooking, cleaning. Family lives [address] down the street and son is also proximal. Pt receives meals during the day from family.     Hand Dominance   Dominant Hand: Right    Extremity/Trunk Assessment   Upper Extremity Assessment Upper Extremity Assessment: Overall WFL for tasks assessed    Lower Extremity Assessment Lower Extremity Assessment: Generalized weakness (L>R)       Communication   Communication: No difficulties  Cognition Arousal/Alertness: Awake/alert Behavior During Therapy: WFL for tasks assessed/performed Overall Cognitive Status: Within Functional Limits for tasks assessed                                          General Comments      Exercises     Assessment/Plan    PT Assessment Patient does not need any further PT services  PT Problem List         PT Treatment Interventions      PT Goals (Current goals can be found in the Care Plan section)  Acute Rehab PT Goals Patient Stated Goal: " I want to go home." PT Goal Formulation: With patient Time For Goal Achievement: 03/20/22 Potential to Achieve Goals: Good    Frequency       Co-evaluation               AM-PAC PT "6 Clicks" Mobility  Outcome Measure Help needed turning from your back to your side while in a flat bed without using bedrails?: None Help needed moving from lying on your back to sitting on the side of a flat bed without using bedrails?: None Help needed moving to and from a bed to a chair (including a wheelchair)?: None Help needed standing up from a chair using your arms (e.g., wheelchair or bedside chair)?: None Help needed to walk in hospital room?: A  Little Help needed climbing 3-5 steps with a railing? : A Little 6 Click Score: 22    End of Session Equipment Utilized During Treatment: Gait belt Activity Tolerance: Patient tolerated treatment well Patient left: in chair;with call bell/phone within reach;with family/visitor present Nurse Communication: Mobility status PT Visit Diagnosis: Unsteadiness on feet (R26.81)    Time: 6712-4580 PT Time Calculation (min) (ACUTE ONLY): 41 min   Charges:   PT Evaluation $PT Eval Low Complexity: 1 Low PT Treatments $Gait Training: 8-22 mins $Therapeutic Exercise: 23-37 mins        Joaquin Music PT DPT 4:22 PM,03/20/22

## 2022-03-20 NOTE — Plan of Care (Signed)
Patent is adequate for discharge to home environment with family and community support.  Patient with be transported via private vehicle by son with all belongings.

## 2022-03-20 NOTE — Assessment & Plan Note (Signed)
D/d include iron deficiency anemia and anemia of chronic disease.  07/18/18 13:50 05/03/19 17:26 03/27/21 15:19 03/27/21 20:56 03/28/21 05:10 03/29/21 08:08 03/19/22 15:14  Hemoglobin 11.6 (L) 12.1 11.0 (L) 10.6 (L) 9.9 (L) 11.6 (L) 10.3 (L)  We will follow and will defer to pcp for anemia eval as it may be contributing to her chronic tachycardia.

## 2022-03-20 NOTE — Progress Notes (Signed)
This RN completing admission profile and confirming current code status with patient. Patient states "If I die, don't bring me back. I do not want to be brought back if my heart stops." Informed patient that her current code status is FULL CODE and educated patient on its meaning. Patient also educated on DNR meaning. Patient verbalized understanding of both, but continues to insist that if she were to "stop breathing or my heart stops, do not bring me back." Hospitalist informed of discussion and states that they will come by to talk with patient about code status before changing current order. Son present at bedside for discussion.

## 2022-03-27 LAB — CULTURE, BLOOD (ROUTINE X 2)
Culture: NO GROWTH
Culture: NO GROWTH
Special Requests: ADEQUATE
Special Requests: ADEQUATE

## 2022-05-10 ENCOUNTER — Ambulatory Visit
Admission: RE | Admit: 2022-05-10 | Discharge: 2022-05-10 | Disposition: A | Discharge status: Home / Self Care | Payer: Medicare Other | Source: Ambulatory Visit | Attending: Family Medicine | Admitting: Family Medicine

## 2022-05-10 DIAGNOSIS — R928 Other abnormal and inconclusive findings on diagnostic imaging of breast: Secondary | ICD-10-CM | POA: Insufficient documentation

## 2022-05-10 DIAGNOSIS — N6320 Unspecified lump in the left breast, unspecified quadrant: Secondary | ICD-10-CM | POA: Insufficient documentation

## 2022-07-11 ENCOUNTER — Emergency Department: Payer: Medicare Other

## 2022-07-11 ENCOUNTER — Other Ambulatory Visit: Payer: Self-pay

## 2022-07-11 ENCOUNTER — Observation Stay
Admission: EM | Admit: 2022-07-11 | Discharge: 2022-07-12 | Disposition: A | Discharge status: Home Health | Payer: Medicare Other | Attending: Hospitalist | Admitting: Hospitalist

## 2022-07-11 DIAGNOSIS — Y92009 Unspecified place in unspecified non-institutional (private) residence as the place of occurrence of the external cause: Secondary | ICD-10-CM

## 2022-07-11 DIAGNOSIS — R338 Other retention of urine: Secondary | ICD-10-CM

## 2022-07-11 DIAGNOSIS — D72829 Elevated white blood cell count, unspecified: Secondary | ICD-10-CM | POA: Diagnosis not present

## 2022-07-11 DIAGNOSIS — E119 Type 2 diabetes mellitus without complications: Secondary | ICD-10-CM | POA: Diagnosis not present

## 2022-07-11 DIAGNOSIS — I10 Essential (primary) hypertension: Secondary | ICD-10-CM | POA: Diagnosis present

## 2022-07-11 DIAGNOSIS — R531 Weakness: Secondary | ICD-10-CM

## 2022-07-11 DIAGNOSIS — R6510 Systemic inflammatory response syndrome (SIRS) of non-infectious origin without acute organ dysfunction: Secondary | ICD-10-CM

## 2022-07-11 DIAGNOSIS — Z1152 Encounter for screening for COVID-19: Secondary | ICD-10-CM | POA: Insufficient documentation

## 2022-07-11 DIAGNOSIS — W19XXXA Unspecified fall, initial encounter: Secondary | ICD-10-CM | POA: Diagnosis not present

## 2022-07-11 DIAGNOSIS — E871 Hypo-osmolality and hyponatremia: Secondary | ICD-10-CM | POA: Diagnosis present

## 2022-07-11 DIAGNOSIS — Z79899 Other long term (current) drug therapy: Secondary | ICD-10-CM | POA: Insufficient documentation

## 2022-07-11 DIAGNOSIS — K529 Noninfective gastroenteritis and colitis, unspecified: Secondary | ICD-10-CM | POA: Diagnosis present

## 2022-07-11 DIAGNOSIS — A419 Sepsis, unspecified organism: Principal | ICD-10-CM

## 2022-07-11 DIAGNOSIS — G20C Parkinsonism, unspecified: Secondary | ICD-10-CM | POA: Diagnosis not present

## 2022-07-11 DIAGNOSIS — D649 Anemia, unspecified: Secondary | ICD-10-CM | POA: Diagnosis present

## 2022-07-11 DIAGNOSIS — Z7984 Long term (current) use of oral hypoglycemic drugs: Secondary | ICD-10-CM | POA: Diagnosis not present

## 2022-07-11 DIAGNOSIS — R32 Unspecified urinary incontinence: Secondary | ICD-10-CM | POA: Diagnosis present

## 2022-07-11 DIAGNOSIS — G20A1 Parkinson's disease without dyskinesia, without mention of fluctuations: Secondary | ICD-10-CM | POA: Diagnosis present

## 2022-07-11 LAB — URINALYSIS, COMPLETE (UACMP) WITH MICROSCOPIC
Bilirubin Urine: NEGATIVE
Glucose, UA: NEGATIVE mg/dL
Ketones, ur: 5 mg/dL — AB
Leukocytes,Ua: NEGATIVE
Nitrite: NEGATIVE
Protein, ur: 100 mg/dL — AB
Specific Gravity, Urine: 1.012 (ref 1.005–1.030)
Squamous Epithelial / HPF: NONE SEEN /HPF (ref 0–5)
pH: 6 (ref 5.0–8.0)

## 2022-07-11 LAB — CBC WITH DIFFERENTIAL/PLATELET
Abs Immature Granulocytes: 0.1 10*3/uL — ABNORMAL HIGH (ref 0.00–0.07)
Basophils Absolute: 0 10*3/uL (ref 0.0–0.1)
Basophils Relative: 0 %
Eosinophils Absolute: 0 10*3/uL (ref 0.0–0.5)
Eosinophils Relative: 0 %
HCT: 28.4 % — ABNORMAL LOW (ref 36.0–46.0)
Hemoglobin: 9 g/dL — ABNORMAL LOW (ref 12.0–15.0)
Immature Granulocytes: 1 %
Lymphocytes Relative: 3 %
Lymphs Abs: 0.4 10*3/uL — ABNORMAL LOW (ref 0.7–4.0)
MCH: 24.7 pg — ABNORMAL LOW (ref 26.0–34.0)
MCHC: 31.7 g/dL (ref 30.0–36.0)
MCV: 77.8 fL — ABNORMAL LOW (ref 80.0–100.0)
Monocytes Absolute: 0.7 10*3/uL (ref 0.1–1.0)
Monocytes Relative: 4 %
Neutro Abs: 14.6 10*3/uL — ABNORMAL HIGH (ref 1.7–7.7)
Neutrophils Relative %: 92 %
Platelets: 383 10*3/uL (ref 150–400)
RBC: 3.65 MIL/uL — ABNORMAL LOW (ref 3.87–5.11)
RDW: 14.6 % (ref 11.5–15.5)
WBC: 15.8 10*3/uL — ABNORMAL HIGH (ref 4.0–10.5)
nRBC: 0 % (ref 0.0–0.2)

## 2022-07-11 LAB — RESP PANEL BY RT-PCR (RSV, FLU A&B, COVID)  RVPGX2
Influenza A by PCR: NEGATIVE
Influenza B by PCR: NEGATIVE
Resp Syncytial Virus by PCR: NEGATIVE
SARS Coronavirus 2 by RT PCR: NEGATIVE

## 2022-07-11 LAB — COMPREHENSIVE METABOLIC PANEL
ALT: <5 U/L (ref 0–44)
AST: 17 U/L (ref 15–41)
Albumin: 3.5 g/dL (ref 3.5–5.0)
Alkaline Phosphatase: 68 U/L (ref 38–126)
Anion gap: 8 (ref 5–15)
BUN: 21 mg/dL (ref 8–23)
CO2: 23 mmol/L (ref 22–32)
Calcium: 9 mg/dL (ref 8.9–10.3)
Chloride: 94 mmol/L — ABNORMAL LOW (ref 98–111)
Creatinine, Ser: 0.69 mg/dL (ref 0.44–1.00)
GFR, Estimated: >60 mL/min (ref >60)
Glucose, Bld: 153 mg/dL — ABNORMAL HIGH (ref 70–99)
Potassium: 3.9 mmol/L (ref 3.5–5.1)
Sodium: 125 mmol/L — ABNORMAL LOW (ref 135–145)
Total Bilirubin: 0.8 mg/dL (ref 0.3–1.2)
Total Protein: 6.2 g/dL — ABNORMAL LOW (ref 6.5–8.1)

## 2022-07-11 LAB — PROTIME-INR
INR: 1.2 (ref 0.8–1.2)
Prothrombin Time: 15.3 seconds — ABNORMAL HIGH (ref 11.4–15.2)

## 2022-07-11 LAB — LACTIC ACID, PLASMA
Lactic Acid, Venous: 1 mmol/L (ref 0.5–1.9)
Lactic Acid, Venous: 1.1 mmol/L (ref 0.5–1.9)

## 2022-07-11 LAB — CK: Total CK: 109 U/L (ref 38–234)

## 2022-07-11 LAB — APTT: aPTT: 38 seconds — ABNORMAL HIGH (ref 24–36)

## 2022-07-11 MED ORDER — PRAVASTATIN SODIUM 20 MG PO TABS
40.0000 mg | ORAL_TABLET | Freq: Every day | ORAL | Status: DC
  Administered 2022-07-11: 40 mg via ORAL
  Filled 2022-07-11: qty 2

## 2022-07-11 MED ORDER — ACETAMINOPHEN 325 MG RE SUPP: 650.0000 mg | Freq: Four times a day (QID) | RECTAL | Status: DC | PRN

## 2022-07-11 MED ORDER — ACETAMINOPHEN 325 MG PO TABS: 650.0000 mg | ORAL_TABLET | Freq: Four times a day (QID) | ORAL | Status: DC | PRN

## 2022-07-11 MED ORDER — LACTATED RINGERS IV BOLUS
1000.0000 mL | Freq: Once | INTRAVENOUS | Status: AC
  Administered 2022-07-11: 1000 mL via INTRAVENOUS

## 2022-07-11 MED ORDER — PANTOPRAZOLE SODIUM 40 MG PO TBEC
40.0000 mg | DELAYED_RELEASE_TABLET | Freq: Every day | ORAL | Status: DC
  Administered 2022-07-11: 40 mg via ORAL
  Filled 2022-07-11: qty 1

## 2022-07-11 MED ORDER — INSULIN ASPART 100 UNIT/ML IJ SOLN
0.0000 [IU] | Freq: Three times a day (TID) | INTRAMUSCULAR | Status: DC
  Administered 2022-07-12: 2 [IU] via SUBCUTANEOUS
  Filled 2022-07-11: qty 1

## 2022-07-11 MED ORDER — SODIUM CHLORIDE 0.9 % IV SOLN: INTRAVENOUS | Status: DC

## 2022-07-11 MED ORDER — ENOXAPARIN SODIUM 40 MG/0.4ML IJ SOSY
40.0000 mg | PREFILLED_SYRINGE | INTRAMUSCULAR | Status: DC
  Administered 2022-07-11: 40 mg via SUBCUTANEOUS
  Filled 2022-07-11: qty 0.4

## 2022-07-11 MED ORDER — ONDANSETRON HCL 4 MG/2ML IJ SOLN: 4.0000 mg | Freq: Four times a day (QID) | INTRAMUSCULAR | Status: DC | PRN

## 2022-07-11 MED ORDER — SODIUM CHLORIDE 0.9 % IV BOLUS
1000.0000 mL | Freq: Once | INTRAVENOUS | Status: AC
  Administered 2022-07-11: 1000 mL via INTRAVENOUS

## 2022-07-11 MED ORDER — SODIUM CHLORIDE 0.9 % IV SOLN
2.0000 g | Freq: Once | INTRAVENOUS | Status: AC
  Administered 2022-07-11: 2 g via INTRAVENOUS
  Filled 2022-07-11: qty 20

## 2022-07-11 MED ORDER — ONDANSETRON HCL 4 MG PO TABS: 4.0000 mg | ORAL_TABLET | Freq: Four times a day (QID) | ORAL | Status: DC | PRN

## 2022-07-11 MED ORDER — INSULIN ASPART 100 UNIT/ML IJ SOLN: 0.0000 [IU] | Freq: Every day | INTRAMUSCULAR | Status: DC

## 2022-07-11 MED ORDER — CARBIDOPA-LEVODOPA 25-250 MG PO TABS
1.0000 | ORAL_TABLET | Freq: Every day | ORAL | Status: DC
  Administered 2022-07-11 – 2022-07-12 (×5): 1 via ORAL
  Filled 2022-07-11 (×7): qty 1

## 2022-07-11 NOTE — Assessment & Plan Note (Signed)
BP soft Will hold carvedilol and losartan overnight

## 2022-07-11 NOTE — ED Notes (Signed)
Pt given orange juice per Dr. Joni Fears

## 2022-07-11 NOTE — ED Provider Notes (Signed)
[facility] Provider Note    Event Date/Time   First MD Initiated Contact with Patient 07/11/22 1303     (approximate)   History   Fall and Weakness   HPI  Lorraine Wyatt is a 77 y.o. female history of Parkinson's disease presents to the ER for evaluation of reported weakness and being found down uncertain downtime.  Lives at home alone.  She denies any pain.  Patient was found by EMS covered in stool and urine.     Physical Exam   Triage Vital Signs: ED Triage Vitals  Enc Vitals Group     BP 07/11/22 1319 127/67     Pulse Rate 07/11/22 1319 (!) 101     Resp 07/11/22 1319 (!) 21     Temp 07/11/22 1319 100 F (37.8 C)     Temp Source 07/11/22 1319 Oral     SpO2 07/11/22 1319 100 %     Weight 07/11/22 1316 130 lb (59 kg)     Height 07/11/22 1316 5\' 4"  (1.626 m)     Head Circumference --      Peak Flow --      Pain Score 07/11/22 1316 0     Pain Loc --      Pain Edu? --      Excl. in [place]? --     Most recent vital signs: Vitals:   07/11/22 1319  BP: 127/67  Pulse: (!) 101  Resp: (!) 21  Temp: 100 F (37.8 C)  SpO2: 100%     Constitutional: Alert, frail and chronically ill appearing Eyes: Conjunctivae are normal.  Head: Atraumatic. Nose: No congestion/rhinnorhea. Mouth/Throat: Mucous membranes are moist.   Neck: Painless ROM.  Cardiovascular:   Good peripheral circulation. Respiratory: Normal respiratory effort.  No retractions.  Gastrointestinal: Soft and nontender.  Musculoskeletal:  no deformity, no hip or leg pain with log roll or flexion.  N./v distally Neurologic:  MAE spontaneously.  Skin:  Skin is warm, dry and intact. No rash noted. Psychiatric: calm and cooperative    ED Results / Procedures / Treatments   Labs (all labs ordered are listed, but only abnormal results are displayed) Labs Reviewed  COMPREHENSIVE METABOLIC PANEL - Abnormal; Notable for the following components:      Result Value   Sodium 125 (*)     Chloride 94 (*)    Glucose, Bld 153 (*)    Total Protein 6.2 (*)    All other components within normal limits  CBC WITH DIFFERENTIAL/PLATELET - Abnormal; Notable for the following components:   WBC 15.8 (*)    RBC 3.65 (*)    Hemoglobin 9.0 (*)    HCT 28.4 (*)    MCV 77.8 (*)    MCH 24.7 (*)    Neutro Abs 14.6 (*)    Lymphs Abs 0.4 (*)    Abs Immature Granulocytes 0.10 (*)    All other components within normal limits  PROTIME-INR - Abnormal; Notable for the following components:   Prothrombin Time 15.3 (*)    All other components within normal limits  APTT - Abnormal; Notable for the following components:   aPTT 38 (*)    All other components within normal limits  RESP PANEL BY RT-PCR (RSV, FLU A&B, COVID)  RVPGX2  CULTURE, BLOOD (ROUTINE X 2)  CULTURE, BLOOD (ROUTINE X 2)  URINE CULTURE  LACTIC ACID, PLASMA  CK  LACTIC ACID, PLASMA  URINALYSIS, COMPLETE (UACMP) WITH MICROSCOPIC  EKG  ED ECG REPORT I, Willy Eddy, the attending physician, personally viewed and interpreted this ECG.   Date: 07/11/2022  EKG Time: 13:28  Rate: 100  Rhythm: sinus  Axis: normal  Intervals: normal  ST&T Change: no stemi, no depressions given motion artifact    RADIOLOGY Please see ED Course for my review and interpretation.  I personally reviewed all radiographic images ordered to evaluate for the above acute complaints and reviewed radiology reports and findings.  These findings were personally discussed with the patient.  Please see medical record for radiology report.    PROCEDURES:  Critical Care performed: No  Procedures   MEDICATIONS ORDERED IN ED: Medications  cefTRIAXone (ROCEPHIN) 2 g in sodium chloride 0.9 % 100 mL IVPB (0 g Intravenous Stopped 07/11/22 1554)  sodium chloride 0.9 % bolus 1,000 mL (1,000 mLs Intravenous New Bag/Given 07/11/22 1554)     IMPRESSION / MDM / ASSESSMENT AND PLAN / ED COURSE  I reviewed the triage vital signs and the nursing  notes.                              Differential diagnosis includes, but is not limited to, Dehydration, sepsis, pna, uti, hypoglycemia, cva, drug effect, withdrawal, encephalitis  Patient presenting to the ER for evaluation of symptoms as described above.  Based on symptoms, risk factors and considered above differential, this presenting complaint could reflect a potentially life-threatening illness therefore the patient will be placed on continuous pulse oximetry and telemetry for monitoring.  Laboratory evaluation will be sent to evaluate for the above complaints.      Clinical Course as of 07/11/22 1602  Wynelle Link Jul 11, 2022  1433 Patient with significant hyponatremia.  Normal renal function. [PR]  1434 Chest x-ray my review and interpretation does not show any evidence of consolidation or infiltrates. [PR]  1600 Patient will be signed out to oncoming physician pending follow-up CT imaging as well as urinalysis.  Anticipate admission. [PR]    Clinical Course User Index [PR] Willy Eddy, MD      FINAL CLINICAL IMPRESSION(S) / ED DIAGNOSES   Final diagnoses:  Sepsis, due to unspecified organism, unspecified whether acute organ dysfunction present [facility])     Rx / DC Orders   ED Discharge Orders     None        Note:  This document was prepared using Dragon voice recognition software and may include unintentional dictation errors.    Willy Eddy, MD 07/11/22 [id number]

## 2022-07-11 NOTE — Assessment & Plan Note (Signed)
Sliding scale insulin coverage Hold home glipizide and metformin and Januvia

## 2022-07-11 NOTE — ED Notes (Signed)
Pt cleaned of dried stool on back, groin and legs by this RN and English as a second language teacher. Pt changed into hospital gown.

## 2022-07-11 NOTE — Assessment & Plan Note (Addendum)
Hemoglobin slightly below Continue to trend    Latest Ref Rng & Units 07/11/2022    1:33 PM 03/20/2022    6:19 AM 03/19/2022    3:14 PM  CBC  WBC 4.0 - 10.5 K/uL 15.8  5.7  8.5   Hemoglobin 12.0 - 15.0 g/dL 9.0  9.7  10.3   Hematocrit 36.0 - 46.0 % 28.4  29.9  32.2   Platelets 150 - 400 K/uL 383  301  336

## 2022-07-11 NOTE — ED Triage Notes (Signed)
Pt from home to ED via [facility], pt reports she slid out of bed last night, which happens often per pt. Pt was unable to get up stated "I was waiting on my son to come over." Pt was on ground until about 1215, pt defecated on self and floor and is covered in feces. Per EMS pt is A&O x4 but lethargic.  Pt has a hx of Parkinson's Disease and Hypertension. Pt states she feels tired. Pt denies any CP, SOB, abd pain, hip/leg pain.

## 2022-07-11 NOTE — H&P (Signed)
History and Physical    Patient: Lorraine Wyatt OIN:[id number] DOB: [birth year] DOA: 07/11/2022 DOS: the patient was seen and examined on 07/11/2022 PCP: Jerl Mina, MD  Patient coming from: Home  Chief Complaint:  Chief Complaint  Patient presents with   Fall   Weakness    HPI: Lorraine Wyatt is a 77 y.o. female with medical history significant for Chronic hyponatremia (baseline sodium 1 27-1 29), Parkinson's, DM, HTN, chronic anemia and urinary incontinence who was brought to the ED following a fall in which she was unable to get up.  Patient reportedly slipped off the bed during the night and remained on the floor for several hours.  When found by EMS she was on the floor covered in stool and urine.  Patient lives alone.  Patient is very frail but able to answer questions.  She denies recent cough, shortness of breath, fever or chills, abdominal pain.  She denies decreased appetite.  She denies pain related to the fall. ED course and data review: Tmax 100 with pulse 101 and respirations 21 with normal BP 127/67 and normal O2 sat.  Labs significant for leukocytosis of 15,800 with normal lactic acid of 1.1.  CK normal at 109.  Hemoglobin at baseline at 9.  CMP significant for sodium of 125.  Respiratory viral panel negative for COVID flu and RSV.  Urinalysis unremarkable except for ketonuria 5. EKG, personally viewed and interpreted showing sinus tachycardia at 100 with nonspecific ST-T wave changes. Chest x-ray clear.  CT head and C-spine nonacute Patient was treated with 3 L NS bolus and started on ceftriaxone.  Hospitalist consulted for admission.     Past Medical History:  Diagnosis Date   Diabetes mellitus without complication (HCC)    Hyperlipidemia    Hypertension    Parkinson disease    Past Surgical History:  Procedure Laterality Date   BREAST CYST ASPIRATION Right    COLONOSCOPY WITH PROPOFOL N/A 12/12/2017   Procedure: COLONOSCOPY WITH PROPOFOL;  Surgeon: Scot Jun, MD;  Location: [facility] ENDOSCOPY;  Service: Endoscopy;  Laterality: N/A;   ESOPHAGOGASTRODUODENOSCOPY (EGD) WITH PROPOFOL N/A 12/12/2017   Procedure: ESOPHAGOGASTRODUODENOSCOPY (EGD) WITH PROPOFOL;  Surgeon: Scot Jun, MD;  Location: [facility] ENDOSCOPY;  Service: Endoscopy;  Laterality: N/A;   Social History:  reports that she has never smoked. She has never used smokeless tobacco. She reports that she does not currently use alcohol. She reports that she does not use drugs.  Allergies  Allergen Reactions   Benztropine Other (See Comments)    Throat swelling   Clarithromycin    Erythromycin Ethylsuccinate    Trandolapril     History reviewed. No pertinent family history.  Prior to Admission medications   Medication Sig Start Date End Date Taking? Authorizing Provider  carbidopa-levodopa (SINEMET IR) 25-250 MG tablet Take 1 tablet by mouth 3 (three) times daily. Patient taking differently: Take 1 tablet by mouth 6 (six) times daily. TAKE AT 0500, 0800, 1100, 1400, 1700 AND 2000. 07/21/18 07/11/22 Yes Sainani, Rolly Pancake, MD  glipiZIDE (GLUCOTROL) 5 MG tablet Take 5 mg by mouth daily before breakfast.    Yes [provider]  losartan (COZAAR) 25 MG tablet Take 25 mg by mouth daily. 03/19/21  Yes [provider]  metFORMIN (GLUCOPHAGE) 1000 MG tablet Take 1,000 mg by mouth 2 (two) times daily with a meal.   Yes [provider]  omeprazole (PRILOSEC) 20 MG capsule Take 20 mg by mouth daily.   Yes [provider]  pravastatin (PRAVACHOL) 40 MG tablet Take 40 mg by mouth daily.   Yes [provider]  sitaGLIPtin (JANUVIA) 100 MG tablet Take 100 mg by mouth daily.   Yes [provider]  traZODone (DESYREL) 50 MG tablet Take 25-100 mg by mouth at bedtime. 06/14/22  Yes [provider]  carvedilol (COREG) 3.125 MG tablet Take 3.125 mg by mouth 2 (two) times daily with a meal. Patient not taking: Reported on 07/11/2022    [provider]  cyanocobalamin (,VITAMIN B-12,) 1000 MCG/ML injection Inject 1 mL into the muscle every 30 (thirty) days. 09/08/20   [provider]  rOPINIRole (REQUIP) 0.25 MG tablet Take 0.25 mg by mouth 3 (three) times daily. Patient not taking: Reported on 07/11/2022    [provider]    Physical Exam: Vitals:   07/11/22 2030 07/11/22 2039 07/11/22 2100 07/11/22 2130  BP: (!) 140/80  (!) 112/57 (!) 111/58  Pulse: (!) 107  92 88  Resp: (!) 22     Temp:      TempSrc:      SpO2: 97%  97% 98%  Weight:  58.5 kg    Height:  5\' 4"  (1.626 m)     Physical Exam Vitals and nursing note reviewed.  Constitutional:      General: She is not in acute distress.    Appearance: She is underweight.     Comments: Frail and chronically ill-appearing.  Underweight  HENT:     Head: Normocephalic and atraumatic.  Cardiovascular:     Rate and Rhythm: Normal rate and regular rhythm.     Heart sounds: Normal heart sounds.  Pulmonary:     Effort: Pulmonary effort is normal.     Breath sounds: Normal breath sounds.  Abdominal:     Palpations: Abdomen is soft.     Tenderness: There is no abdominal tenderness.  Neurological:     Mental Status: Mental status is at baseline.     Labs on Admission: I have personally reviewed following labs and imaging studies  CBC: Recent Labs  Lab 07/11/22 1333  WBC 15.8*  NEUTROABS 14.6*  HGB 9.0*  HCT 28.4*  MCV 77.8*  PLT 383   Basic Metabolic Panel: Recent Labs  Lab 07/11/22 1333  NA 125*  K 3.9  CL 94*  CO2 23  GLUCOSE 153*  BUN 21  CREATININE 0.69  CALCIUM 9.0   GFR: Estimated Creatinine Clearance: 52.5 mL/min (by C-G formula based on SCr of 0.69 mg/dL). Liver Function Tests: Recent Labs  Lab 07/11/22 1333  AST 17  ALT <5  ALKPHOS 68  BILITOT 0.8  PROT 6.2*  ALBUMIN 3.5   No results for input(s): "LIPASE", "AMYLASE" in the last 168 hours. No results for input(s): "AMMONIA" in the last 168 hours. Coagulation  Profile: Recent Labs  Lab 07/11/22 1340  INR 1.2   Cardiac Enzymes: Recent Labs  Lab 07/11/22 1333  CKTOTAL 109   BNP (last 3 results) No results for input(s): "PROBNP" in the last 8760 hours. HbA1C: No results for input(s): "HGBA1C" in the last 72 hours. CBG: No results for input(s): "GLUCAP" in the last 168 hours. Lipid Profile: No results for input(s): "CHOL", "HDL", "LDLCALC", "TRIG", "CHOLHDL", "LDLDIRECT" in the last 72 hours. Thyroid Function Tests: No results for input(s): "TSH", "T4TOTAL", "FREET4", "T3FREE", "THYROIDAB" in the last 72 hours. Anemia Panel: No results for input(s): "VITAMINB12", "FOLATE", "FERRITIN", "TIBC", "IRON", "RETICCTPCT" in the last 72 hours. Urine analysis:  Component Value Date/Time   COLORURINE YELLOW (A) 07/11/2022 2041   APPEARANCEUR HAZY (A) 07/11/2022 2041   LABSPEC 1.012 07/11/2022 2041   PHURINE 6.0 07/11/2022 2041   GLUCOSEU NEGATIVE 07/11/2022 2041   HGBUR SMALL (A) 07/11/2022 2041   BILIRUBINUR NEGATIVE 07/11/2022 2041   KETONESUR 5 (A) 07/11/2022 2041   PROTEINUR 100 (A) 07/11/2022 2041   NITRITE NEGATIVE 07/11/2022 2041   LEUKOCYTESUR NEGATIVE 07/11/2022 2041    Radiological Exams on Admission: CT HEAD WO CONTRAST ( )  Result Date: 07/11/2022 CLINICAL DATA:  Patient reports sliding out of bed last night. Patient was unable to get up. Six EXAM: CT HEAD WITHOUT CONTRAST CT CERVICAL SPINE WITHOUT CONTRAST TECHNIQUE: Multidetector CT imaging of the head and cervical spine was performed following the standard protocol without intravenous contrast. Multiplanar CT image reconstructions of the cervical spine were also generated. RADIATION DOSE REDUCTION: This exam was performed according to the departmental dose-optimization program which includes automated exposure control, adjustment of the mA and/or kV according to patient size and/or use of iterative reconstruction technique. COMPARISON:  CT head 03/20/2022 FINDINGS: CT HEAD  FINDINGS Brain: No evidence of acute infarction, hemorrhage, hydrocephalus, extra-axial collection or mass lesion/mass effect. There is mild diffuse low-attenuation within the subcortical and periventricular white matter compatible with chronic microvascular disease. Vascular: No hyperdense vessel or unexpected calcification. Skull: Normal. Negative for fracture or focal lesion. Sinuses/Orbits: Paranasal sinuses and mastoid air cells are clear. Other: None. CT CERVICAL SPINE FINDINGS Alignment: No signs of acute posttraumatic malalignment of the cervical spine. 2 mm anterolisthesis of C4 on C5 is likely secondary to chronic spondylosis Skull base and vertebrae: No acute fracture. No primary bone lesion or focal pathologic process. Soft tissues and spinal canal: No prevertebral fluid or swelling. No visible canal hematoma. Disc levels: Marked disc space narrowing and endplate spurring noted at C4-5. Upper chest: Negative. Other: None IMPRESSION: 1. No acute intracranial abnormality. 2. Chronic microvascular disease and brain atrophy. 3. No evidence for acute cervical spine fracture or subluxation. 4. Cervical degenerative disc disease. Electronically Signed   By: Signa Kell M.D.   On: 07/11/2022 16:09   CT Cervical Spine Wo Contrast  Result Date: 07/11/2022 CLINICAL DATA:  Patient reports sliding out of bed last night. Patient was unable to get up. Six EXAM: CT HEAD WITHOUT CONTRAST CT CERVICAL SPINE WITHOUT CONTRAST TECHNIQUE: Multidetector CT imaging of the head and cervical spine was performed following the standard protocol without intravenous contrast. Multiplanar CT image reconstructions of the cervical spine were also generated. RADIATION DOSE REDUCTION: This exam was performed according to the departmental dose-optimization program which includes automated exposure control, adjustment of the mA and/or kV according to patient size and/or use of iterative reconstruction technique. COMPARISON:  CT head  03/20/2022 FINDINGS: CT HEAD FINDINGS Brain: No evidence of acute infarction, hemorrhage, hydrocephalus, extra-axial collection or mass lesion/mass effect. There is mild diffuse low-attenuation within the subcortical and periventricular white matter compatible with chronic microvascular disease. Vascular: No hyperdense vessel or unexpected calcification. Skull: Normal. Negative for fracture or focal lesion. Sinuses/Orbits: Paranasal sinuses and mastoid air cells are clear. Other: None. CT CERVICAL SPINE FINDINGS Alignment: No signs of acute posttraumatic malalignment of the cervical spine. 2 mm anterolisthesis of C4 on C5 is likely secondary to chronic spondylosis Skull base and vertebrae: No acute fracture. No primary bone lesion or focal pathologic process. Soft tissues and spinal canal: No prevertebral fluid or swelling. No visible canal hematoma. Disc levels: Marked disc space narrowing and endplate  spurring noted at C4-5. Upper chest: Negative. Other: None IMPRESSION: 1. No acute intracranial abnormality. 2. Chronic microvascular disease and brain atrophy. 3. No evidence for acute cervical spine fracture or subluxation. 4. Cervical degenerative disc disease. Electronically Signed   By: Signa Kell M.D.   On: 07/11/2022 16:09   DG Chest Port 1 View  Result Date: 07/11/2022 CLINICAL DATA:  Questionable sepsis. EXAM: PORTABLE CHEST 1 VIEW COMPARISON:  March 19, 2022 FINDINGS: The heart size and mediastinal contours are within normal limits. Both lungs are clear. The visualized skeletal structures are unremarkable. IMPRESSION: No active disease. Electronically Signed   By: Gerome Sam III M.D.   On: 07/11/2022 14:02     Data Reviewed: Relevant notes from primary care and specialist visits, past discharge summaries as available in EHR, including Care Everywhere. Prior diagnostic testing as pertinent to current admission diagnoses Updated medications and problem lists for reconciliation ED  course, including vitals, labs, imaging, treatment and response to treatment Triage notes, nursing and pharmacy notes and ED provider's notes Notable results as noted in HPI   Assessment and Plan: * Fall at home, initial encounter Generalized weakness Suspect dehydration, in combination with Parkinson's disease.  Possible occult infection Unlikely medication related: Patient was started on trazodone 50 nightly in December but states she stopped taking it after 2 days and she knew it would increase her risk of falling No evidence of traumatic injury IV hydration PT evaluation Fall precautions  Leukocytosis Suspect gastroenteritis SIRS, possible sepsis Leukocytosis of 15,000 but unclear source  SIRS criteria include temp of 100, tachycardia and tachypnea Patient had 2 loose BMs in the past couple days and was found covered in dried stool. no other localizing signs of infection Chest x-ray clear, urinalysis unremarkable. 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 Acute worsening likely related to dehydration IV hydration with NS and monitor   Essential hypertension BP soft Will hold carvedilol and losartan overnight  Type 2 diabetes mellitus (HCC) Sliding scale insulin coverage Hold home glipizide and metformin and Januvia  Parkinson's disease Continue Sinemet and ropinirole  Anemia Hemoglobin slightly below Continue to trend    Latest Ref Rng & Units 07/11/2022    1:33 PM 03/20/2022    6:19 AM 03/19/2022    3:14 PM  CBC  WBC 4.0 - 10.5 K/uL 15.8  5.7  8.5   Hemoglobin 12.0 - 15.0 g/dL 9.0  9.7  24.2   Hematocrit 36.0 - 46.0 % 28.4  29.9  32.2   Platelets 150 - 400 K/uL 383  301  336      Urinary incontinence in female Barrier  precautions        DVT prophylaxis: Lovenox  Consults: none  Advance Care Planning:   Code Status: DNR   Family Communication: Son Rocky Crafts at bedside  Disposition Plan: Back to previous home environment  Severity of Illness: The appropriate patient status for this patient is INPATIENT. Inpatient status is judged to be reasonable and necessary in order to provide the required intensity of service to ensure the patient's safety. The patient's presenting symptoms, physical exam findings, and initial radiographic and laboratory data in the context of their chronic comorbidities is felt to place them at high risk for further clinical  deterioration. Furthermore, it is not anticipated that the patient will be medically stable for discharge from the hospital within 2 midnights of admission.   * I certify that at the point of admission it is my clinical judgment that the patient will require inpatient hospital care spanning beyond 2 midnights from the point of admission due to high intensity of service, high risk for further deterioration and high frequency of surveillance required.*  Author: Athena Masse, MD 07/11/2022 10:44 PM  For on call review [email or web address].

## 2022-07-11 NOTE — Assessment & Plan Note (Signed)
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

## 2022-07-11 NOTE — Assessment & Plan Note (Addendum)
Generalized weakness Suspect dehydration, in combination with Parkinson's disease.  Possible occult infection Unlikely medication related: Patient was started on trazodone 50 nightly in December but states she stopped taking it after 2 days and she knew it would increase her risk of falling No evidence of traumatic injury IV hydration PT evaluation Fall precautions

## 2022-07-11 NOTE — Assessment & Plan Note (Signed)
Barrier precautions

## 2022-07-11 NOTE — Assessment & Plan Note (Signed)
Continue Sinemet and ropinirole

## 2022-07-11 NOTE — ED Notes (Signed)
Pt states she is DNR, do not have any papers currently.

## 2022-07-11 NOTE — Assessment & Plan Note (Addendum)
Suspect gastroenteritis SIRS, possible sepsis Leukocytosis of 15,000 but unclear source  SIRS criteria include temp of 100, tachycardia and tachypnea Patient had 2 loose BMs in the past couple days and was found covered in dried stool. no other localizing signs of infection Chest x-ray clear, urinalysis unremarkable. Received 3 L NS bolus in the ED Continue to hydrate Received Rocephin but will hold off on further antibiotics pending procalcitonin GI panel

## 2022-07-12 ENCOUNTER — Encounter: Payer: Self-pay | Admitting: Internal Medicine

## 2022-07-12 DIAGNOSIS — Y92009 Unspecified place in unspecified non-institutional (private) residence as the place of occurrence of the external cause: Secondary | ICD-10-CM

## 2022-07-12 DIAGNOSIS — A419 Sepsis, unspecified organism: Secondary | ICD-10-CM | POA: Diagnosis not present

## 2022-07-12 DIAGNOSIS — E871 Hypo-osmolality and hyponatremia: Secondary | ICD-10-CM | POA: Diagnosis not present

## 2022-07-12 DIAGNOSIS — W19XXXA Unspecified fall, initial encounter: Secondary | ICD-10-CM | POA: Diagnosis present

## 2022-07-12 DIAGNOSIS — R338 Other retention of urine: Secondary | ICD-10-CM | POA: Diagnosis not present

## 2022-07-12 LAB — CBC
HCT: 29.5 % — ABNORMAL LOW (ref 36.0–46.0)
Hemoglobin: 9.5 g/dL — ABNORMAL LOW (ref 12.0–15.0)
MCH: 25 pg — ABNORMAL LOW (ref 26.0–34.0)
MCHC: 32.2 g/dL (ref 30.0–36.0)
MCV: 77.6 fL — ABNORMAL LOW (ref 80.0–100.0)
Platelets: 384 10*3/uL (ref 150–400)
RBC: 3.8 MIL/uL — ABNORMAL LOW (ref 3.87–5.11)
RDW: 15 % (ref 11.5–15.5)
WBC: 10.1 10*3/uL (ref 4.0–10.5)
nRBC: 0 % (ref 0.0–0.2)

## 2022-07-12 LAB — GASTROINTESTINAL PANEL BY PCR, STOOL (REPLACES STOOL CULTURE)

## 2022-07-12 LAB — CBG MONITORING, ED
Glucose-Capillary: 116 mg/dL — ABNORMAL HIGH (ref 70–99)
Glucose-Capillary: 133 mg/dL — ABNORMAL HIGH (ref 70–99)
Glucose-Capillary: 95 mg/dL (ref 70–99)

## 2022-07-12 LAB — OSMOLALITY, URINE: Osmolality, Ur: 415 mOsm/kg (ref 300–900)

## 2022-07-12 LAB — SODIUM, URINE, RANDOM: Sodium, Ur: 83 mmol/L

## 2022-07-12 LAB — PROCALCITONIN: Procalcitonin: 0.63 ng/mL

## 2022-07-12 LAB — BASIC METABOLIC PANEL
Anion gap: 10 (ref 5–15)
BUN: 16 mg/dL (ref 8–23)
CO2: 21 mmol/L — ABNORMAL LOW (ref 22–32)
Calcium: 8.7 mg/dL — ABNORMAL LOW (ref 8.9–10.3)
Chloride: 97 mmol/L — ABNORMAL LOW (ref 98–111)
Creatinine, Ser: 0.73 mg/dL (ref 0.44–1.00)
GFR, Estimated: >60 mL/min (ref >60)
Glucose, Bld: 109 mg/dL — ABNORMAL HIGH (ref 70–99)
Potassium: 4.1 mmol/L (ref 3.5–5.1)
Sodium: 128 mmol/L — ABNORMAL LOW (ref 135–145)

## 2022-07-12 LAB — OSMOLALITY: Osmolality: 268 mOsm/kg — ABNORMAL LOW (ref 275–295)

## 2022-07-12 NOTE — TOC Initial Note (Signed)
Transition of Care [facility]) - Initial/Assessment Note    Patient Details  Name: Lorraine Wyatt MRN: [id number] Date of Birth: 07/26/1945  Transition of Care [facility]) CM/SW Contact:    Allayne Butcher, RN Phone Number: 07/12/2022, 2:11 PM  Clinical Narrative:                 Patient medically cleared for discharge home after coming in for a fall yesterday.  RNCM met with patient at the bedside.  [name] grandson is with her, her son [name] has gone home to get her some clothes.  Patient lives alone and uses a rollator.  She agrees to [facility] PT and would like to use the same company as last time, which was [facility].  Barbara Cower with [facility] accepts referral for [facility] PT.  Son to transport patient home today.   Expected Discharge Plan: Home w Home Health Services Barriers to Discharge: Barriers Resolved   Patient Goals and CMS Choice Patient states their goals for this hospitalization and ongoing recovery are:: wants to go home CMS Medicare.gov Compare Post Acute Care list provided to:: Patient Choice offered to / list presented to : Patient      Expected Discharge Plan and Services   Discharge Planning Services: CM Consult Post Acute Care Choice: Home Health   Expected Discharge Date: 07/12/22               DME Arranged: N/A DME Agency: [facility]       HH Arranged: PT HH Agency: [facility]) Date HH Agency Contacted: 07/12/22 Time HH Agency Contacted: [id number] Representative spoke with at [facility] Agency: Barbara Cower  Prior Living Arrangements/Services   Lives with:: Self Patient language and need for interpreter reviewed:: Yes Do you feel safe going back to the place where you live?: Yes      Need for Family Participation in Patient Care: Yes (Comment) Care giver support system in place?: Yes (comment) Current home services: DME (rollator) Criminal Activity/Legal Involvement Pertinent to Current Situation/Hospitalization: No - Comment as needed  Activities of Daily  Living Home Assistive Devices/Equipment: Cane (specify quad or straight), Walker (specify type), Wheelchair, Eyeglasses ADL Screening (condition at time of admission) [name] cognitive ability adequate to safely complete daily activities?: Yes Is the patient deaf or have difficulty hearing?: No Does the patient have difficulty seeing, even when wearing glasses/contacts?: No Does the patient have difficulty concentrating, remembering, or making decisions?: No Patient able to express need for assistance with ADLs?: Yes Does the patient have difficulty dressing or bathing?: No Independently performs ADLs?: Yes (appropriate for developmental age) Does the patient have difficulty walking or climbing stairs?: No Weakness of Legs: None Weakness of Arms/Hands: None  Permission Sought/Granted Permission sought to share information with : Case Manager, Family Supports, Other (comment) Permission granted to share information with : Yes, Verbal Permission Granted  Share Information with NAME: Savannaha Stonerock  Permission granted to share info w AGENCY: [facility]  Permission granted to share info w Relationship: son  Permission granted to share info w Contact Information: [phone]  Emotional Assessment Appearance:: Appears stated age Attitude/Demeanor/Rapport: Engaged Affect (typically observed): Accepting Orientation: : Oriented to Self, Oriented to Place, Oriented to  Time, Oriented to Situation Alcohol / Substance Use: Not Applicable Psych Involvement: No (comment)  Admission diagnosis:  Gastroenteritis [K52.9] Fall [W19.XXXA] Patient Active Problem List   Diagnosis Date Noted   Fall 07/12/2022   Gastroenteritis 07/11/2022   Leukocytosis 07/11/2022   Generalized weakness 07/11/2022   Fall at home,  initial encounter 07/11/2022   SIRS (systemic inflammatory response syndrome) (HCC) 07/11/2022   Anemia 03/20/2022   Dizziness 03/19/2022   Sinus tachycardia 03/19/2022   Hyponatremia,  acute on chronic 03/27/2021   Acute lower UTI 03/27/2021   Type 2 diabetes mellitus ([place]) 12/25/2019   TIA (transient ischemic attack) 07/18/2018   Urinary incontinence in female 11/05/2015   Parkinson's disease 06/03/2015   Essential hypertension 06/17/2014   Mixed hyperlipidemia 06/17/2014   PCP:  [place] Pink, MD Pharmacy:   [id number] [address], [place] [address] [place] [address] Phone: [phone] Fax: [phone]     Social Determinants of Health (SDOH) Social History: [place]: No Food Insecurity (07/12/2022)  Housing: Low Risk  (07/12/2022)  Transportation Needs: No Transportation Needs (07/12/2022)  Utilities: Not At Risk (07/12/2022)  Tobacco Use: Low Risk  (07/12/2022)   SDOH Interventions:     Readmission Risk Interventions     No data to display

## 2022-07-12 NOTE — Care Management Obs Status (Signed)
[place] NOTIFICATION   Patient Details  Name: DULA HAVLIK MRN: [id number] Date of Birth: [birth year]   Medicare Observation Status Notification Given:  Yes    Shelbie Hutching, RN 07/12/2022, 1:43 PM

## 2022-07-12 NOTE — TOC Transition Note (Signed)
Transition of Care [facility]) - CM/SW Discharge Note   Patient Details  Name: Lorraine Wyatt MRN: [id number] Date of Birth: 09/25/1945  [facility] of [facility] CM/SW Contact:  Shelbie Hutching, RN Phone Number: 07/12/2022, 3:07 PM   Clinical Narrative:    Spoke with patient's son [name], he is worried about his mother being so weak and going back home.  She does not qualify for short term rehab because she has not had a qualifying stay.  Recommended, she stay with family or family stay with her and or that they look into private duty care agencies.   Asked MD to add aide, OT and SW to home health orders.     Final next level of care: Home w Home Health Services Barriers to Discharge: Barriers Resolved   Patient Goals and CMS Choice CMS Medicare.gov Compare Post Acute Care list provided to:: Patient Choice offered to / list presented to : Patient  Discharge Placement                         Discharge Plan and Services Additional resources added to the After Visit Summary for     Discharge Planning Services: CM Consult Post Acute Care Choice: Home Health          DME Arranged: N/A DME Agency: [facility]       HH Arranged: PT, OT, Nurse's Aide, Social Work [employer] Agency: [facility] ([place]) Date [place]: 07/12/22 Time [place]: [place] Representative spoke with at [place]: [place] ([place]) Interventions [place]: No Food Insecurity (07/12/2022)  Housing: Low Risk  (07/12/2022)  Transportation Needs: No Transportation Needs (07/12/2022)  Utilities: Not At Risk (07/12/2022)  Tobacco Use: Low Risk  (07/12/2022)     Readmission Risk Interventions     No data to display

## 2022-07-12 NOTE — IPAL (Signed)
  Interdisciplinary Goals of Care Family Meeting   Date carried out: 07/12/2022  Location of the meeting: Bedside  Member's involved: Physician and Family Member or next of kin, son Tana Conch Power of Attorney or Loss adjuster, chartered: Patient  Discussion: We discussed goals of care for Wendee Copp .   I have reviewed medical records including EPIC notes, labs and imaging, assessed the patient and then met with patient and son [name] to discuss major active diagnoses, plan of care, natural trajectory, prognosis, GOC, EOL wishes, disposition and options including full code and DNR with and without comfort care. Questions and concerns were addressed. They are in agreement to continue current plan of care and elect to be DNR.     Code status:   Code Status: DNR   Disposition: Continue current acute care  Time spent for the meeting: [place], MD  07/12/2022, 12:02 AM

## 2022-07-12 NOTE — Discharge Summary (Incomplete)
Physician Discharge Summary   Lorraine Wyatt  female DOB: [birth year]  AST:[id number]  PCP: Jerl Mina, MD  Admit date: 07/11/2022 Discharge date: 07/12/2022  Admitted From: home Disposition:  home Home Health: Yes CODE STATUS: DNR   Hospital Course:  For full details, please see H&P, progress notes, consult notes and ancillary notes.  Briefly,  Lorraine Wyatt is a 77 y.o. female with medical history significant for Chronic hyponatremia (baseline sodium 127-129), Parkinson's, DM, HTN, chronic anemia and urinary incontinence who was brought to the ED following a fall in which she was unable to get up.    Patient reportedly slipped off the bed during the night and remained on the floor for several hours.  When found by EMS she was on the floor covered in stool and urine.  Patient lives alone.     * Fall at home, initial encounter Generalized weakness pt reported she slid out of bed last night, which happens often per pt.  Pt walked with RN, and appeared to be at her baseline.  Offered to have formal PT eval to assess need for SNF rehab, however, pt was adamant that she will not go to SNF and wanted to return home to her previous living arrangement.  Pt did seem to need more assistance at home, however, since pt refused placement, family will need to provide extra assistance.  No further need to stay in the hospital, so pt was discharged with [facility] PT/OT/aide/SW.   Leukocytosis Leukocytosis of 15,000 but unclear source.  Chest x-ray clear, urinalysis unremarkable, pt denied dysuria or other infectious symptoms.  Pt was covered in stool when found because she was on the ground and defecated on herself, however, no report of frequent diarrhea since presentation.  GI path neg.  Received Rocephin x1 in the ED, but abx was not continued.   Hyponatremia, acute on chronic Sodium 125 on presentation, down from baseline of 127-129. 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   Essential hypertension Pt was not taking coreg PTA. Losartan resumed after discharge.   Type 2 diabetes mellitus (HCC) resume home glipizide, metformin and Januvia after discharge.   Parkinson's disease Pt was not taking ropinirole PTA. --cont Sinemet    Anemia --Hgb 9's, which appears to be the baseline.   Urinary incontinence in female Chronic issue.    Discharge Diagnoses:  Principal Problem:   Fall at home, initial encounter Active Problems:   Leukocytosis   Gastroenteritis   SIRS (systemic inflammatory response syndrome) (HCC)   Generalized weakness   Hyponatremia, acute on chronic   Essential hypertension   Parkinson's disease   Type 2 diabetes mellitus (HCC)   Anemia   Urinary incontinence in female   Fall   30 Day Unplanned Readmission Risk Score    Flowsheet Row ED from 07/11/2022 in [facility] EM[facility]GENCY DEPART[facility]  30 Day Unplanned Readmission Risk Score (%) 15.53 Filed at 07/12/2022 1200       This score is the patient's risk of an unplanned readmission within 30 days of being discharged (0 -100%). The score is based on dignosis, age, lab data, medications, orders, and past utilization.   Low:  0-14.9   Medium: 15-21.9   High: 22-29.9   Extreme: 30 and above         Discharge Instructions:  Allergies as of 07/12/2022       Reactions  Benztropine Other (See Comments)   Throat swelling   Clarithromycin    Erythromycin Ethylsuccinate    Trandolapril         Medication List     STOP taking these medications    carvedilol 3.125 MG tablet Commonly known as: COREG   rOPINIRole 0.25 MG tablet Commonly known as: REQUIP       TAKE these medications    carbidopa-levodopa 25-250 MG tablet Commonly known as: SINEMET IR Take 1 tablet by mouth 3 (three) times daily. What changed:  when to take this additional instructions    cyanocobalamin 1000 MCG/ML injection Commonly known as: VITAMIN B12 Inject 1 mL into the muscle every 30 (thirty) days.   glipiZIDE 5 MG tablet Commonly known as: GLUCOTROL Take 5 mg by mouth daily before breakfast.   losartan 25 MG tablet Commonly known as: COZAAR Take 25 mg by mouth daily.   metFORMIN 1000 MG tablet Commonly known as: GLUCOPHAGE Take 1,000 mg by mouth 2 (two) times daily with a meal.   omeprazole 20 MG capsule Commonly known as: PRILOSEC Take 20 mg by mouth daily.   pravastatin 40 MG tablet Commonly known as: PRAVACHOL Take 40 mg by mouth daily.   sitaGLIPtin 100 MG tablet Commonly known as: JANUVIA Take 100 mg by mouth daily.   traZODone 50 MG tablet Commonly known as: DESYREL Take 25-100 mg by mouth at bedtime.         Follow-up Information     Jerl Mina, MD Follow up in 1 week(s).   Specialty: Family Medicine Contact information: [address] [facility] [place] [address] [phone]                 Allergies  Allergen Reactions   Benztropine Other (See Comments)    Throat swelling   Clarithromycin    Erythromycin Ethylsuccinate    Trandolapril      The results of significant diagnostics from this hospitalization (including imaging, microbiology, ancillary and laboratory) are listed below for reference.   Consultations:   Procedures/Studies: CT HEAD WO CONTRAST ( )  Result Date: 07/11/2022 CLINICAL DATA:  Patient reports sliding out of bed last night. Patient was unable to get up. Six EXAM: CT HEAD WITHOUT CONTRAST CT CERVICAL SPINE WITHOUT CONTRAST TECHNIQUE: Multidetector CT imaging of the head and cervical spine was performed following the standard protocol without intravenous contrast. Multiplanar CT image reconstructions of the cervical spine were also generated. RADIATION DOSE REDUCTION: This exam was performed according to the departmental dose-optimization program which includes automated  exposure control, adjustment of the mA and/or kV according to patient size and/or use of iterative reconstruction technique. COMPARISON:  CT head 03/20/2022 FINDINGS: CT HEAD FINDINGS Brain: No evidence of acute infarction, hemorrhage, hydrocephalus, extra-axial collection or mass lesion/mass effect. There is mild diffuse low-attenuation within the subcortical and periventricular white matter compatible with chronic microvascular disease. Vascular: No hyperdense vessel or unexpected calcification. Skull: Normal. Negative for fracture or focal lesion. Sinuses/Orbits: Paranasal sinuses and mastoid air cells are clear. Other: None. CT CERVICAL SPINE FINDINGS Alignment: No signs of acute posttraumatic malalignment of the cervical spine. 2 mm anterolisthesis of C4 on C5 is likely secondary to chronic spondylosis Skull base and vertebrae: No acute fracture. No primary bone lesion or focal pathologic process. Soft tissues and spinal canal: No prevertebral fluid or swelling. No visible canal hematoma. Disc levels: Marked disc space narrowing and endplate spurring noted at C4-5. Upper chest: Negative. Other: None IMPRESSION: 1. No acute  intracranial abnormality. 2. Chronic microvascular disease and brain atrophy. 3. No evidence for acute cervical spine fracture or subluxation. 4. Cervical degenerative disc disease. Electronically Signed   By: Signa Kell M.D.   On: 07/11/2022 16:09   CT Cervical Spine Wo Contrast  Result Date: 07/11/2022 CLINICAL DATA:  Patient reports sliding out of bed last night. Patient was unable to get up. Six EXAM: CT HEAD WITHOUT CONTRAST CT CERVICAL SPINE WITHOUT CONTRAST TECHNIQUE: Multidetector CT imaging of the head and cervical spine was performed following the standard protocol without intravenous contrast. Multiplanar CT image reconstructions of the cervical spine were also generated. RADIATION DOSE REDUCTION: This exam was performed according to the departmental dose-optimization  program which includes automated exposure control, adjustment of the mA and/or kV according to patient size and/or use of iterative reconstruction technique. COMPARISON:  CT head 03/20/2022 FINDINGS: CT HEAD FINDINGS Brain: No evidence of acute infarction, hemorrhage, hydrocephalus, extra-axial collection or mass lesion/mass effect. There is mild diffuse low-attenuation within the subcortical and periventricular white matter compatible with chronic microvascular disease. Vascular: No hyperdense vessel or unexpected calcification. Skull: Normal. Negative for fracture or focal lesion. Sinuses/Orbits: Paranasal sinuses and mastoid air cells are clear. Other: None. CT CERVICAL SPINE FINDINGS Alignment: No signs of acute posttraumatic malalignment of the cervical spine. 2 mm anterolisthesis of C4 on C5 is likely secondary to chronic spondylosis Skull base and vertebrae: No acute fracture. No primary bone lesion or focal pathologic process. Soft tissues and spinal canal: No prevertebral fluid or swelling. No visible canal hematoma. Disc levels: Marked disc space narrowing and endplate spurring noted at C4-5. Upper chest: Negative. Other: None IMPRESSION: 1. No acute intracranial abnormality. 2. Chronic microvascular disease and brain atrophy. 3. No evidence for acute cervical spine fracture or subluxation. 4. Cervical degenerative disc disease. Electronically Signed   By: Signa Kell M.D.   On: 07/11/2022 16:09   DG Chest Port 1 View  Result Date: 07/11/2022 CLINICAL DATA:  Questionable sepsis. EXAM: PORTABLE CHEST 1 VIEW COMPARISON:  March 19, 2022 FINDINGS: The heart size and mediastinal contours are within normal limits. Both lungs are clear. The visualized skeletal structures are unremarkable. IMPRESSION: No active disease. Electronically Signed   By: Gerome Sam III M.D.   On: 07/11/2022 14:02      Labs: BNP (last 3 results) Recent Labs    03/19/22 1514  BNP 68.1   Basic Metabolic  Panel: Recent Labs  Lab 07/11/22 1333 07/12/22 0500  NA 125* 128*  K 3.9 4.1  CL 94* 97*  CO2 23 21*  GLUCOSE 153* 109*  BUN 21 16  CREATININE 0.69 0.73  CALCIUM 9.0 8.7*   Liver Function Tests: Recent Labs  Lab 07/11/22 1333  AST 17  ALT <5  ALKPHOS 68  BILITOT 0.8  PROT 6.2*  ALBUMIN 3.5   No results for input(s): "LIPASE", "AMYLASE" in the last 168 hours. No results for input(s): "AMMONIA" in the last 168 hours. CBC: Recent Labs  Lab 07/11/22 1333 07/12/22 0500  WBC 15.8* 10.1  NEUTROABS 14.6*  --   HGB 9.0* 9.5*  HCT 28.4* 29.5*  MCV 77.8* 77.6*  PLT 383 384   Cardiac Enzymes: Recent Labs  Lab 07/11/22 1333  CKTOTAL 109   BNP: Invalid input(s): "POCBNP" CBG: Recent Labs  Lab 07/12/22 0031 07/12/22 0809 07/12/22 1226  GLUCAP 116* 133* 95   D-Dimer No results for input(s): "DDIMER" in the last 72 hours. Hgb A1c No results for input(s): "HGBA1C" in the  last 72 hours. Lipid Profile No results for input(s): "CHOL", "HDL", "LDLCALC", "TRIG", "CHOLHDL", "LDLDIRECT" in the last 72 hours. Thyroid function studies No results for input(s): "TSH", "T4TOTAL", "T3FREE", "THYROIDAB" in the last 72 hours.  Invalid input(s): "FREET3" Anemia work up No results for input(s): "VITAMINB12", "FOLATE", "FERRITIN", "TIBC", "IRON", "RETICCTPCT" in the last 72 hours. Urinalysis    Component Value Date/Time   COLORURINE YELLOW (A) 07/11/2022 2041   APPEARANCEUR HAZY (A) 07/11/2022 2041   LABSPEC 1.012 07/11/2022 2041   PHURINE 6.0 07/11/2022 2041   GLUCOSEU NEGATIVE 07/11/2022 2041   HGBUR SMALL (A) 07/11/2022 2041   BILIRUBINUR NEGATIVE 07/11/2022 2041   KETONESUR 5 (A) 07/11/2022 2041   PROTEINUR 100 (A) 07/11/2022 2041   NITRITE NEGATIVE 07/11/2022 2041   LEUKOCYTESUR NEGATIVE 07/11/2022 2041   Sepsis Labs Recent Labs  Lab 07/11/22 1333 07/12/22 0500  WBC 15.8* 10.1   Microbiology Recent Results (from the past 240 hour(s))  Resp panel by RT-PCR  (RSV, Flu A&B, Covid) Anterior Nasal Swab     Status: None   Collection Time: 07/11/22  1:33 PM   Specimen: Anterior Nasal Swab  Result Value Ref Range Status   SARS Coronavirus 2 by RT PCR NEGATIVE NEGATIVE Final    Comment: (NOTE) SARS-CoV-2 target nucleic acids are NOT DETECTED.  The SARS-CoV-2 RNA is generally detectable in upper respiratory specimens during the acute phase of infection. The lowest concentration of SARS-CoV-2 viral copies this assay can detect is 138 copies/mL. A negative result does not preclude SARS-Cov-2 infection and should not be used as the sole basis for treatment or other patient management decisions. A negative result may occur with  improper specimen collection/handling, submission of specimen other than nasopharyngeal swab, presence of viral mutation(s) within the areas targeted by this assay, and inadequate number of viral copies(<138 copies/mL). A negative result must be combined with clinical observations, patient history, and epidemiological information. The expected result is Negative.  Fact Sheet for Patients:  BloggerCourse.com  Fact Sheet for Healthcare Providers:  SeriousBroker.it  This test is no t yet approved or cleared by the [place] FDA and  has been authorized for detection and/or diagnosis of SARS-CoV-2 by FDA under an Emergency Use Authorization (EUA). This EUA will remain  in effect (meaning this test can be used) for the duration of the COVID-19 declaration under Section 564(b)(1) of the Act, 21 U.S.C.section 360bbb-3(b)(1), unless the authorization is terminated  or revoked sooner.       Influenza A by PCR NEGATIVE NEGATIVE Final   Influenza B by PCR NEGATIVE NEGATIVE Final    Comment: (NOTE) The Xpert Xpress SARS-CoV-2/FLU/RSV plus assay is intended as an aid in the diagnosis of influenza from Nasopharyngeal swab specimens and should not be used as a sole basis for  treatment. Nasal washings and aspirates are unacceptable for Xpert Xpress SARS-CoV-2/FLU/RSV testing.  Fact Sheet for Patients: BloggerCourse.com  Fact Sheet for Healthcare Providers: SeriousBroker.it  This test is not yet approved or cleared by the [place] FDA and has been authorized for detection and/or diagnosis of SARS-CoV-2 by FDA under an Emergency Use Authorization (EUA). This EUA will remain in effect (meaning this test can be used) for the duration of the COVID-19 declaration under Section 564(b)(1) of the Act, 21 U.S.C. section 360bbb-3(b)(1), unless the authorization is terminated or revoked.     Resp Syncytial Virus by PCR NEGATIVE NEGATIVE Final    Comment: (NOTE) Fact Sheet for Patients: BloggerCourse.com  Fact Sheet for Healthcare Providers: SeriousBroker.it  This test is not yet approved or cleared by the [place] and has been authorized for detection and/or diagnosis of SARS-CoV-2 by FDA under an Emergency Use Authorization (EUA). This EUA will remain in effect (meaning this test can be used) for the duration of the COVID-19 declaration under Section 564(b)(1) of the Act, 21 U.S.C. section 360bbb-3(b)(1), unless the authorization is terminated or revoked.  Performed at [facility], [address] [place], [place] [address]   Blood Culture (routine x 2)     Status: None (Preliminary result)   Collection Time: 07/11/22  1:33 PM   Specimen: BLOOD  Result Value Ref Range Status   Specimen Description BLOOD BLOOD RIGHT ARM  Final   Special Requests   Final    BOTTLES DRAWN AEROBIC AND ANAEROBIC Blood Culture adequate volume   Culture   Final    NO GROWTH < 24 HOURS Performed at [facility], [address], [place], [place] [address]    Report Status PENDING  Incomplete  Blood Culture (routine x 2)     Status: None  (Preliminary result)   Collection Time: 07/11/22  1:33 PM   Specimen: BLOOD RIGHT HAND  Result Value Ref Range Status   Specimen Description BLOOD RIGHT HAND  Final   Special Requests   Final    BOTTLES DRAWN AEROBIC AND ANAEROBIC Blood Culture adequate volume   Culture   Final    NO GROWTH < 24 HOURS Performed at [facility], [address] [place], [place] [address]    Report Status PENDING  Incomplete  Gastrointestinal Panel by PCR , Stool     Status: None   Collection Time: 07/12/22  5:00 AM   Specimen: Stool  Result Value Ref Range Status   Campylobacter species NOT DETECTED NOT DETECTED Final   Plesimonas shigelloides NOT DETECTED NOT DETECTED Final   Salmonella species NOT DETECTED NOT DETECTED Final   Yersinia enterocolitica NOT DETECTED NOT DETECTED Final   Vibrio species NOT DETECTED NOT DETECTED Final   Vibrio cholerae NOT DETECTED NOT DETECTED Final   Enteroaggregative E coli (EAEC) NOT DETECTED NOT DETECTED Final   Enteropathogenic E coli (EPEC) NOT DETECTED NOT DETECTED Final   Enterotoxigenic E coli (ETEC) NOT DETECTED NOT DETECTED Final   Shiga like toxin producing E coli (STEC) NOT DETECTED NOT DETECTED Final   Shigella/Enteroinvasive E coli (EIEC) NOT DETECTED NOT DETECTED Final   Cryptosporidium NOT DETECTED NOT DETECTED Final   Cyclospora cayetanensis NOT DETECTED NOT DETECTED Final   Entamoeba histolytica NOT DETECTED NOT DETECTED Final   Giardia lamblia NOT DETECTED NOT DETECTED Final   Adenovirus F40/41 NOT DETECTED NOT DETECTED Final   Astrovirus NOT DETECTED NOT DETECTED Final   Norovirus GI/GII NOT DETECTED NOT DETECTED Final   Rotavirus A NOT DETECTED NOT DETECTED Final   Sapovirus (I, II, IV, and V) NOT DETECTED NOT DETECTED Final    Comment: Performed at [facility], [address] [place], [place] [address]     Total time spend on discharging this patient, including the last patient exam, discussing the hospital stay,  instructions for ongoing care as it relates to all pertinent caregivers, as well as preparing the medical discharge records, prescriptions, and/or referrals as applicable, is 45 minutes.    Enzo Bi, MD  [facility] 07/12/2022, 1:32 PM

## 2022-07-12 NOTE — Care Management CC44 (Signed)
Condition Code 44 Documentation Completed  Patient Details  Name: KENNIS BUELL MRN: [id number] Date of Birth: [birth year]   Condition Code 44 given:  Yes Patient signature on Condition Code 44 notice:  Yes Documentation of 2 MD's agreement:  Yes Code 44 added to claim:  Yes    Shelbie Hutching, RN 07/12/2022, 1:43 PM

## 2022-07-12 NOTE — ED Notes (Signed)
Pt ambulated to bathroom and back to bed, with this RN and tolerated well. Had BM and urinated.

## 2022-07-14 LAB — URINE CULTURE: Culture: >=100000 — AB

## 2022-07-16 LAB — CULTURE, BLOOD (ROUTINE X 2)
Culture: NO GROWTH
Culture: NO GROWTH
Special Requests: ADEQUATE
Special Requests: ADEQUATE

## 2022-07-27 ENCOUNTER — Telehealth: Payer: Self-pay

## 2022-07-27 NOTE — Telephone Encounter (Signed)
Palliative care outreach to patient son, Lorraine Wyatt, to schedule initial PC consult.   Consult scheduled for Mon 08/02/22 @1215  with RN/SW.

## 2022-08-02 ENCOUNTER — Other Ambulatory Visit: Payer: Medicare Other

## 2022-08-02 VITALS — BP 130/82 | HR 93

## 2022-08-02 DIAGNOSIS — Z515 Encounter for palliative care: Secondary | ICD-10-CM

## 2022-08-02 NOTE — Progress Notes (Signed)
PATIENT NAME: Lorraine Wyatt DOB: 01/09/1946 MRN: [id number]  PRIMARY CARE PROVIDER: Maryland Pink, MD  RESPONSIBLE PARTY:  Acct ID - Guarantor Home Phone Work Phone Relationship Acct Type  [id number] [name]  Self P/F     [address] [place], [place], [place] [address]   Joint visit with patient, her son and daughter-in-law and [place], [place].  ACP:  Confirms DNR.  AD uploaded by SW.  Patient desires to remain in her home.  She is open to hospice when appropriate.    EMS Support:  Patient endorses calling EMS this morning.  She was in her rollator and unable to get up.  Palliative Care:  Explained services and consents signed by son.   Parkinson's:  Patient is living home alone.  She has a life alert in place through her security system. Patient is able to do most of ADL's on her own. She does have assistance of her son, granddaughter and cousin who all live close by.  Using a rollator for safe mobility.  Most recent fall about 2 weeks ago.  Patient denies any issues with swallowing.     CODE STATUS: DNR ADVANCED DIRECTIVES: Yes MOST FORM: No PPS: 50%   PHYSICAL EXAM:   VITALS: Today's Vitals   08/02/22 1236  BP: 130/82  Pulse: 93  SpO2: 99%    LUNGS: clear to auscultation  CARDIAC: Cor RRR}  EXTREMITIES: trace edema SKIN: Skin color, texture, turgor normal. No rashes or lesions or mobility and turgor normal  NEURO: positive for dizziness and gait problems       Lorenza Burton, RN

## 2022-08-02 NOTE — Progress Notes (Signed)
COMMUNITY PALLIATIVE CARE SW NOTE  PATIENT NAME: Lorraine Wyatt DOB: 12-15-1945 MRN: [id number]  PRIMARY CARE PROVIDER: Maryland Pink, MD  RESPONSIBLE PARTY:  Acct ID - Guarantor Home Phone Work Phone Relationship Acct Type  [id number] Lorraine [phone]  Self P/F     [address], [place], [place] [address]     PLAN OF CARE and INTERVENTIONS:              GOALS OF CARE/ ADVANCE CARE PLANNING:    Goals include to maximize quality of life and assist with pain management. Our advance care planning conversation included a discussion about:    The value and importance of advance care planning  Review and updating or creation of an advance directive document.                          Patient is a DNR. Patients son, Lorraine Wyatt, holds POA.   2.        SOCIAL/EMOTIONAL/SPIRITUAL ASSESSMENT/ INTERVENTIONS:         Palliative care encounter: SW and RN completed joint initial in-home visit with patient, son and DIL.    Functional changes/updates: patient with palliative care informative meeting, with Marla. No active cancers noted and patient not ready for hospice at this time. Patient with progressive Parkinson's disease. Patient recently had an episode this morning where she could not get out of her chair and had to call EMS.  Parkinsons: per patients DIL patient has been experiencing hallucinations and tremors.  Falls: recent fall nearly 2 weeks ago. No injuries sustained. Patient utilizes rollator for mobility.  PC will continue to monitor for hospice eligibility.    Psychosocial assessment: completed.   In home support: patient resides in home independently. Patients family checks on her daily. Will talk about in home caregivers for the mornings as patient tends to have more difficulty in the mornings.  Transportation: no needs.  Food: no food insecurities witnessed. Patient share that she has a good appetite.  Safety and long-term planning: patient feels safe in her home  and desires to remain in her home with support.    SW discussed goals, reviewed care plan, provided emotional support, used active and reflective listening in the form of reciprocity emotional response. Questions and concerns were addressed. The patient/family was encouraged to call with any additional questions and/or concerns. PC Provided general support and encouragement, no other unmet needs identified. Will continue to follow.   3.         PATIENT/CAREGIVER EDUCATION/ COPING:   Appearance: well groomed, appropriate given situation  Mental Status: Alert/oriented. Eye Contact: Good. Able to engage in proper eye contact  Thought Process: rational  Thought Content: not assessed  Speech: normal  Mood: Normal and calm Affect: Congruent to endorsed mood, full ranging Insight: normal Judgement: normal  Interaction Style: Cooperative   Patient A&O and able to make needs known, patient engaged in fluent conversation and answered all questions appropriately. Cognitive deficits witnessed.    4.         PERSONAL EMERGENCY PLAN:  Patient will call 9-1-1 for emergencies. Patient has life alert system with CPI.    5.         COMMUNITY RESOURCES COORDINATION/ HEALTH CARE NAVIGATION:  patients son manages her care.    6.      FINANCIAL CONCERNS/NEEDS: none.  Primary Health Insurance:  Medicare Secondary Health Insurance: BCBS Prescription Coverage: Yes, no history of difficulty obtaining or affording prescriptions reported.     SOCIAL HX:  Social History   Tobacco Use   Smoking status: Never   Smokeless tobacco: Never  Substance Use Topics   Alcohol use: Not Currently    CODE STATUS: DNR  ADVANCED DIRECTIVES: Y MOST FORM COMPLETE:  N HOSPICE EDUCATION PROVIDED: Y  PPS: patient able to complete ADL's semi independently.    Time spent: 50 min       [place], [place]

## 2022-08-12 ENCOUNTER — Other Ambulatory Visit: Payer: Self-pay | Admitting: Family Medicine

## 2022-08-12 ENCOUNTER — Encounter: Payer: Self-pay | Admitting: Family Medicine

## 2022-08-12 DIAGNOSIS — N6489 Other specified disorders of breast: Secondary | ICD-10-CM

## 2022-08-12 DIAGNOSIS — N644 Mastodynia: Secondary | ICD-10-CM

## 2022-08-30 ENCOUNTER — Other Ambulatory Visit: Payer: Medicare Other

## 2022-08-30 VITALS — BP 120/64 | Temp 96.5°F

## 2022-08-30 DIAGNOSIS — Z515 Encounter for palliative care: Secondary | ICD-10-CM

## 2022-08-30 NOTE — Progress Notes (Signed)
COMMUNITY PALLIATIVE CARE SW NOTE  PATIENT NAME: Lorraine Wyatt DOB: 1946/04/11 MRN: [id number]  PRIMARY CARE PROVIDER: Maryland Pink, MD  RESPONSIBLE PARTY:  Acct ID - Guarantor Home Phone Work Phone Relationship Acct Type  [id number] [name]  Self P/F     [address], [place], [place] [address]     PLAN OF CARE and INTERVENTIONS:              GOALS OF CARE/ ADVANCE CARE PLANNING:    Goals include to maximize quality of life and assist with pain management. Our advance care planning conversation included a discussion about:    The value and importance of advance care planning  Review and updating or creation of an advance directive document.                          Patient is a DNR. Patients son, [name], holds POA.    2.        SOCIAL/EMOTIONAL/SPIRITUAL ASSESSMENT/ INTERVENTIONS:         Palliative care encounter: SW and RN completed joint initial in-home visit with patient, son and DIL.    Functional changes/updates: No declines since previous PC visit. Patient saw PCP and neurologist since previous visits. Sinmet and trazadone has been added to medication regimen. New pill box with automatic reminder discussed with family, Bryson Corona link to pill box shared with patients son.   Parkinsons: patent share that the sinemet is helping with tremors but still has uncontrolled movements of limbs (arms and hands mostly). Per son hallucinations have decreased over the past 2 weeks.   Falls:has had 3-4 falls since recent hospitalization. No major injuries sustained, just swelling to R wrist, which has improved. Patient utilizes rollator for mobility.   PC will continue to monitor for hospice eligibility.    Psychosocial assessment: completed.    In home support: patient resides in home independently. Patients family checks on her daily. Patient has [facility] visiting. Patient has private caregiver 3x wk that assist with bathing mostly.   Transportation: no  needs.   Food: no food insecurities witnessed. Patient share that she has a good appetite. Patient drinks boost. Despite good appetite, patient has had a significant weight loss within the past year. PCP has recommended increase in calorie intake.   Safety and long-term planning: patient feels safe in her home and desires to remain in her home with support.    SW discussed goals, reviewed care plan, provided emotional support, used active and reflective listening in the form of reciprocity emotional response. Questions and concerns were addressed. The patient/family was encouraged to call with any additional questions and/or concerns. PC Provided general support and encouragement, no other unmet needs identified. Will continue to follow.   3.         PATIENT/CAREGIVER EDUCATION/ COPING:   Appearance: well groomed, appropriate given situation  Mental Status: Alert/oriented. Eye Contact: Good. Able to engage in proper eye contact  Thought Process: rational  Thought Content: not assessed  Speech: normal  Mood: Normal and calm Affect: Congruent to endorsed mood, full ranging Insight: normal Judgement: normal  Interaction Style: Cooperative   Patient A&O and able to make needs known, patient engaged in fluent conversation and answered all questions appropriately. Cognitive deficits witnessed.    4.         PERSONAL EMERGENCY PLAN:  Patient will call 9-1-1 for emergencies. Patient has life alert system with [facility].  5.         COMMUNITY RESOURCES COORDINATION/ HEALTH CARE NAVIGATION:  patients son manages her care.    6.      FINANCIAL CONCERNS/NEEDS: none.                         Primary Health Insurance:  Medicare Secondary Health Insurance: BCBS Prescription Coverage: Yes, no history of difficulty obtaining or affording prescriptions reported.        SOCIAL HX:  Social History   Tobacco Use   Smoking status: Never   Smokeless tobacco: Never  Substance Use Topics   Alcohol use:  Not Currently    CODE STATUS: DNR  ADVANCED DIRECTIVES: Y MOST FORM COMPLETE:  N HOSPICE EDUCATION PROVIDED: N  PPS: patient is semi (I) with ADL's.      Doreene Eland, Jarrell

## 2022-08-30 NOTE — Progress Notes (Signed)
PATIENT NAME: Lorraine Wyatt DOB: [birth year] MRN: [id number]  PRIMARY CARE PROVIDER: Maryland Pink, MD  RESPONSIBLE PARTY:  Acct ID - Guarantor Home Phone Work Phone Relationship Acct Type  [id number] Lorraine [phone]  Self P/F     [address] [place], [place],  [address]    Home visit completed with patient and son Lorraine Wyatt.  Appetite:  Remains good per patient.  She is mostly using the microwave and air fryer. Family is also assisting with meals.  Patient reports eating multiple times during the day.   Constipation:  BM's every 5-7 days.  Patient advises she takes a stimulant when needed.   Diabetes: A1C 6.3 per patient.  She is unable to check blood sugars most days due to dyskinesia.  Compliant with medications.   Dizziness:  Has improved since stopping melatonin and trazodone.   Insomnia:  Taking trazodone which has been helpful.  She typically takes one dose at bedtime and wakes up 4 hours later and takes the other 1/2 dose.   Medication Management:  [name] son is assisting with a pill box.  We discussed pill box with alarm as a possible option to help patient remember to take her medications.  Son continues to call with reminders.   Parkinson's:  3-4 falls since our last visit.  Wore a wrist brace to the right hand due to swelling.  Now improved. Son also reports spells of stiffness requiring him to come and assist patient.  Some hallucinations continue but has improved some in the last week.   Resources:  Continues with home health therapy.  Now has home health aide who assisting with showers and dressing 2 days a week.   Still considering changing to life alert companies so she has access device by voice and also have fall detection.   Weight Loss:  Most recent weight at PCP office 110 lbs this month.  About 10 lb weight loss in the last 5 months.  BMI down to 18.9.   Currently taking in Boost several times a day.  Will follow back up with PCP to ensure weight  is remaining stable per patient.     CODE STATUS: DNR ADVANCED DIRECTIVES: Yes MOST FORM: No PPS: 50%   PHYSICAL EXAM:   VITALS: Today's Vitals   08/30/22 1202  BP: 120/64  Temp: (!) 96.5 F (35.8 C)  SpO2: 90%    LUNGS: clear to auscultation  CARDIAC: Cor RRR}  EXTREMITIES: - for edema SKIN: Skin color, texture, turgor normal. No rashes or lesions or normal  NEURO: positive for gait problems       Lorraine Burton, RN

## 2022-09-27 ENCOUNTER — Other Ambulatory Visit: Payer: Medicare Other

## 2022-09-27 DIAGNOSIS — Z515 Encounter for palliative care: Secondary | ICD-10-CM

## 2022-09-27 NOTE — Progress Notes (Signed)
COMMUNITY PALLIATIVE CARE SW NOTE  PATIENT NAME: Lorraine Wyatt DOB: July 23, 1945 MRN: [id number]  PRIMARY CARE PROVIDER: Maryland Pink, MD  RESPONSIBLE PARTY:  Acct ID - Guarantor Home Phone Work Phone Relationship Acct Type  [id number] [name]  Self P/F     [address], [place], [place] [address]     PLAN OF CARE and INTERVENTIONS:              GOALS OF CARE/ ADVANCE CARE PLANNING:    Goals include to maximize quality of life and assist with pain management. Our advance care planning conversation included a discussion about:    The value and importance of advance care planning  Review and updating or creation of an advance directive document.                          Patient is a DNR. Patients son, [name], holds POA.    2.        SOCIAL/EMOTIONAL/SPIRITUAL ASSESSMENT/ INTERVENTIONS:         Palliative care encounter: SW and RN completed joint initial in-home visit with patient, son and DIL.    Functional changes/updates: patient share that she feels weaker overall. Patient continues with [facility] PT through [facility] but feels she is still weak. Hospice discussed briefly as patient states she would prefer hospice services after therapy ends. Patient in need of a hospital to assist with bed mobility and fall prevention out of bed. SW called PCP office to request ordering a hospital bed for patient.    Parkinsons: patient continues with uncontrolled movements of limbs (arms and hands mostly). No hallucinations reported.   Appetite: Good. Patient eats a lot during the day. Patient typically eats big meals and then snacks in between. Despite this patient continues to loose weight unintentionally. Current weight today 104lbs. on 2/9 patient weighed 110lbs. PC RN to weigh patient again on next visit.   Falls:patient had a recent fall, this past Tuesday morning. Where she fell in her room on her R shoulder, no injuries sustained. Patients life alert button alerted ems and  her son.   PC will continue to monitor for hospice eligibility.    Psychosocial assessment: completed.    In home support: patient resides in home independently. Patients family checks on her daily. Patient has [facility] PT visiting. Patient has private caregiver 1x wk that assist with bathing mostly.      3.         PATIENT/CAREGIVER EDUCATION/ COPING:   Appearance: well groomed, appropriate given situation  Mental Status: Alert/oriented. Eye Contact: Good. Able to engage in proper eye contact  Thought Process: rational  Thought Content: not assessed  Speech: normal  Mood: Normal and calm Affect: Congruent to endorsed mood, full ranging Insight: normal Judgement: normal  Interaction Style: Cooperative   Patient A&O and able to make needs known, patient engaged in fluent conversation and answered all questions appropriately. Cognitive deficits witnessed.    4.         PERSONAL EMERGENCY PLAN:  Patient will call 9-1-1 for emergencies. Patient has life alert system with [employer].    5.         COMMUNITY RESOURCES COORDINATION/ HEALTH CARE NAVIGATION:  patients son manages her care.    6.      FINANCIAL CONCERNS/NEEDS: none.  Primary Health Insurance:  Medicare Secondary Health Insurance: BCBS                         Prescription Coverage: Yes, patient shared that she has had some difficulty affording copay for [place]. SW to mail patient copay assistance application through medication manufacturer.         SOCIAL HX:  Social History   Tobacco Use   Smoking status: Never   Smokeless tobacco: Never  Substance Use Topics   Alcohol use: Not Currently    CODE STATUS: DNR ADVANCED DIRECTIVES: Y MOST FORM COMPLETE:  N HOSPICE EDUCATION PROVIDED: Y  PPS: 40%    Time spent: 45 min  [place] Henrene Pastor, [place]

## 2022-10-04 ENCOUNTER — Ambulatory Visit
Admission: RE | Admit: 2022-10-04 | Discharge: 2022-10-04 | Disposition: A | Discharge status: Home / Self Care | Payer: Medicare Other | Source: Ambulatory Visit | Attending: Family Medicine | Admitting: Family Medicine

## 2022-10-04 DIAGNOSIS — N6489 Other specified disorders of breast: Secondary | ICD-10-CM | POA: Diagnosis not present

## 2022-10-20 ENCOUNTER — Other Ambulatory Visit: Payer: Medicare Other

## 2022-10-20 VITALS — BP 140/80 | HR 95 | Temp 97.3°F | Wt 107.0 lb

## 2022-10-20 DIAGNOSIS — Z515 Encounter for palliative care: Secondary | ICD-10-CM

## 2022-10-20 NOTE — Progress Notes (Signed)
PATIENT NAME: Lorraine Wyatt DOB: 10-18-1945 MRN: [id number]  PRIMARY CARE PROVIDER: Jerl Mina, MD  RESPONSIBLE PARTY:  Acct ID - Guarantor Home Phone Work Phone Relationship Acct Type  [id number] [name]* [phone]  Self P/F     [address], [place], [place] [address]   Home visit completed with patient and granddaughter.  Home Health:  Continues with weekly visits from PT.  Scheduled for discharge on 11/28/22  Pain:  left hip sciatica pain taking tylenol to manage.  Parkinson's Disease:  Dyskinesia more calm today than last visit.  Patient advised she took her sinemet this am and it is helpful  Weight loss:  Obtained weight today.  107 lbs.  13 lb weight loss in 7 months.  Appetite remains good per patient.  She feels she is eating better since her granddaughter is living with her.      CODE STATUS: DNR ADVANCED DIRECTIVES: Yes MOST FORM: No PPS: 40%   PHYSICAL EXAM:   VITALS: Today's Vitals   10/20/22 1006  BP: (!) 140/80  Pulse: 95  Temp: (!) 97.3 F (36.3 C)  SpO2: 97%    LUNGS: clear to auscultation  CARDIAC: Cor RRR}  EXTREMITIES: 2+ bilateral lower extremity edema SKIN: Skin color, texture, turgor normal. No rashes or lesions or mobility and turgor normal  NEURO: positive for gait problems       Truitt Merle, RN

## 2022-11-12 ENCOUNTER — Inpatient Hospital Stay: Payer: Medicare Other | Attending: Internal Medicine | Admitting: Internal Medicine

## 2022-11-12 ENCOUNTER — Encounter: Payer: Self-pay | Admitting: Internal Medicine

## 2022-11-12 ENCOUNTER — Inpatient Hospital Stay: Payer: Medicare Other

## 2022-11-12 VITALS — BP 111/86 | HR 90 | Temp 97.5°F | Wt 112.0 lb

## 2022-11-12 DIAGNOSIS — D509 Iron deficiency anemia, unspecified: Secondary | ICD-10-CM | POA: Diagnosis not present

## 2022-11-12 DIAGNOSIS — G20A1 Parkinson's disease without dyskinesia, without mention of fluctuations: Secondary | ICD-10-CM | POA: Diagnosis not present

## 2022-11-12 DIAGNOSIS — Z79899 Other long term (current) drug therapy: Secondary | ICD-10-CM | POA: Diagnosis not present

## 2022-11-12 NOTE — Progress Notes (Signed)
Pt. Had a mamma gram on 10/04/2022 and she would like to discuss the results. Pt. Recently was diagnosed with a UTI and she finished taking all of the UTI medication.

## 2022-11-12 NOTE — Progress Notes (Signed)
[facility]  [phone] [phone]  ID: Lorraine Wyatt OB: 1945/10/25  MR#: [id number]  AOZ#:[id number]  Patient Care Team: Jerl Mina, MD as PCP - General (Family Medicine)  REFERRING PROVIDER: Dr. Burnett Sheng  REASON FOR REFERRAL: Iron deficiency anemia  HPI: Lorraine Wyatt is a 76 y.o. female with past medical history of diabetes, hyperlipidemia, hypertension, Parkinson's disease on Sinemet referred to hematology for management of iron deficiency anemia.  Patient has progressive anemia since September 2022.  Labs from 11/01/2022 showed hemoglobin 8.6, MCV 77, WBC 5.7 and platelets 361.  Creatinine normal.  Iron 36, saturation 8%.  Ferritin not done.  08/02/2022-iron 23, saturation 5%.  Ferritin not done. Iron panel from 2020 was normal.Colonoscopy by Dr. Markham Jordan on 12/12/2017 done for iron deficiency anemia showed internal hemorrhoids otherwise unremarkable.  Endoscopy showed grade a reflux esophagitis with no bleeding, gastritis.  Could not tolerate oral iron due to worsening constipation and mental status change.  Patient seen today in wheelchair accompanied by granddaughter.  She has advanced Parkinson's disease with involuntary movements.  Denies any bleeding.  REVIEW OF SYSTEMS:   ROS  As per HPI. Otherwise, a complete review of systems is negative.  PAST MEDICAL HISTORY: Past Medical History:  Diagnosis Date   Diabetes mellitus without complication (HCC)    Hyperlipidemia    Hypertension    Parkinson disease     PAST SURGICAL HISTORY: Past Surgical History:  Procedure Laterality Date   BREAST CYST ASPIRATION Right    COLONOSCOPY WITH PROPOFOL N/A 12/12/2017   Procedure: COLONOSCOPY WITH PROPOFOL;  Surgeon: Scot Jun, MD;  Location: [facility] ENDOSCOPY;  Service: Endoscopy;  Laterality: N/A;   ESOPHAGOGASTRODUODENOSCOPY (EGD) WITH PROPOFOL N/A 12/12/2017   Procedure: ESOPHAGOGASTRODUODENOSCOPY (EGD) WITH PROPOFOL;  Surgeon:  Scot Jun, MD;  Location: [facility] ENDOSCOPY;  Service: Endoscopy;  Laterality: N/A;    FAMILY HISTORY: History reviewed. No pertinent family history.  HEALTH MAINTENANCE: Social History   Tobacco Use   Smoking status: Never   Smokeless tobacco: Never  Vaping Use   Vaping Use: Never used  Substance Use Topics   Alcohol use: Not Currently   Drug use: Never     Allergies  Allergen Reactions   Benztropine Other (See Comments)    Throat swelling   Clarithromycin    Erythromycin Ethylsuccinate    Trandolapril     Current Outpatient Medications  Medication Sig Dispense Refill   carbidopa-levodopa (SINEMET IR) 25-250 MG tablet Take 1 tablet by mouth 3 (three) times daily. (Patient taking differently: Take 1 tablet by mouth 6 (six) times daily. TAKE AT 0500, 0800, 1100, 1400, 1700 AND 2000.) 90 tablet 1   glipiZIDE (GLUCOTROL) 5 MG tablet Take 5 mg by mouth daily before breakfast.      losartan (COZAAR) 25 MG tablet Take 25 mg by mouth daily.     metFORMIN (GLUCOPHAGE) 1000 MG tablet Take 1,000 mg by mouth 2 (two) times daily with a meal.     omeprazole (PRILOSEC) 20 MG capsule Take 20 mg by mouth daily.     pravastatin (PRAVACHOL) 40 MG tablet Take 40 mg by mouth daily.     sitaGLIPtin (JANUVIA) 100 MG tablet Take 100 mg by mouth daily.     traZODone (DESYREL) 50 MG tablet Take 25-100 mg by mouth at bedtime.     cyanocobalamin (,VITAMIN B-12,) 1000 MCG/ML injection Inject 1 mL into the muscle every 30 (thirty) days. (Patient not taking: Reported on 11/12/2022)  No current facility-administered medications for this visit.    OBJECTIVE: Vitals:   11/12/22 1134  BP: 111/86  Pulse: 90  Temp: (!) 97.5 F (36.4 C)  SpO2: 99%     Body mass index is 19.22 kg/m.      General: Well-developed, well-nourished, no acute distress. Eyes: Pink conjunctiva, anicteric sclera. HEENT: Normocephalic, moist mucous membranes, clear oropharnyx. Lungs: Clear to auscultation  bilaterally. Heart: Regular rate and rhythm. No rubs, murmurs, or gallops. Abdomen: Soft, nontender, nondistended. No organomegaly noted, normoactive bowel sounds. Musculoskeletal: No edema, cyanosis, or clubbing. Neuro: Alert, answering all questions appropriately. Cranial nerves grossly intact. Skin: No rashes or petechiae noted. Psych: Normal affect. Lymphatics: No cervical, calvicular, axillary or inguinal LAD.   LAB RESULTS:  Lab Results  Component Value Date   NA 128 (L) 07/12/2022   K 4.1 07/12/2022   CL 97 (L) 07/12/2022   CO2 21 (L) 07/12/2022   GLUCOSE 109 (H) 07/12/2022   BUN 16 07/12/2022   CREATININE 0.73 07/12/2022   CALCIUM 8.7 (L) 07/12/2022   PROT 6.2 (L) 07/11/2022   ALBUMIN 3.5 07/11/2022   AST 17 07/11/2022   ALT <5 07/11/2022   ALKPHOS 68 07/11/2022   BILITOT 0.8 07/11/2022   GFRNONAA >60 07/12/2022   GFRAA >60 05/03/2019    Lab Results  Component Value Date   WBC 10.1 07/12/2022   NEUTROABS 14.6 (H) 07/11/2022   HGB 9.5 (L) 07/12/2022   HCT 29.5 (L) 07/12/2022   MCV 77.6 (L) 07/12/2022   PLT 384 07/12/2022    No results found for: "TIBC", "FERRITIN", "IRONPCTSAT"   STUDIES: No results found.  ASSESSMENT AND PLAN:   Lorraine Wyatt is a 78 y.o. female with pmh of diabetes, hyperlipidemia, hypertension, Parkinson's disease on Sinemet referred to hematology for management of iron deficiency anemia.  # Iron deficiency anemia - Patient has progressive anemia since September 2022.  Could not tolerate iron pill due to worsening constipation and mental status change.  Labs from 11/01/2022 showed hemoglobin 8.6, MCV 77, WBC 5.7 and platelets 361.  Creatinine normal.  Iron 36, saturation 8%.  Ferritin not done. 08/02/2022-iron 23, saturation 5%.  Ferritin not done. .Colonoscopy by Dr. Markham Jordan on 12/12/2017 done for iron deficiency anemia showed internal hemorrhoids otherwise unremarkable.  Endoscopy showed grade a reflux esophagitis with no bleeding,  gastritis.  -Discussed with the patient about IV Venofer 200 mg weekly x 5 doses.  Occasional side effects such as nausea and back pain was discussed.  Rarely with potential risk for allergic reactions.  Patient is agreeable to proceed.  Her last colonoscopy and endoscopy was in 2019.  Declined GI referral for repeat endoscopy and colonoscopy due to age and other comorbidities.  Orders Placed This Encounter  Procedures   CBC with Differential/Platelet   Iron and TIBC   Ferritin   Vitamin B12    Schedule for IV Venofer weekly x 5 RTC in 5 months for MD visit, labs, possible Venofer  Patient expressed understanding and was in agreement with this plan. She also understands that She can call clinic at any time with any questions, concerns, or complaints.   I spent a total of 45 minutes reviewing chart data, face-to-face evaluation with the patient, counseling and coordination of care as detailed above.  Michaelyn Barter, MD   11/12/2022 11:54 AM

## 2022-11-15 ENCOUNTER — Inpatient Hospital Stay: Payer: Medicare Other

## 2022-11-15 VITALS — BP 144/80

## 2022-11-15 DIAGNOSIS — D509 Iron deficiency anemia, unspecified: Secondary | ICD-10-CM

## 2022-11-15 MED ORDER — SODIUM CHLORIDE 0.9 % IV SOLN
200.0000 mg | Freq: Once | INTRAVENOUS | Status: AC
  Administered 2022-11-15: 200 mg via INTRAVENOUS
  Filled 2022-11-15: qty 200

## 2022-11-15 MED ORDER — SODIUM CHLORIDE 0.9 % IV SOLN
Freq: Once | INTRAVENOUS | Status: AC
  Filled 2022-11-15: qty 250

## 2022-11-15 NOTE — Patient Instructions (Signed)

## 2022-11-22 ENCOUNTER — Inpatient Hospital Stay: Payer: Medicare Other

## 2022-11-22 VITALS — BP 128/76 | HR 88 | Temp 96.7°F | Resp 18

## 2022-11-22 DIAGNOSIS — D509 Iron deficiency anemia, unspecified: Secondary | ICD-10-CM

## 2022-11-22 MED ORDER — SODIUM CHLORIDE 0.9 % IV SOLN
200.0000 mg | Freq: Once | INTRAVENOUS | Status: AC
  Administered 2022-11-22: 200 mg via INTRAVENOUS
  Filled 2022-11-22: qty 200

## 2022-11-22 MED ORDER — SODIUM CHLORIDE 0.9 % IV SOLN
Freq: Once | INTRAVENOUS | Status: AC
  Filled 2022-11-22: qty 250

## 2022-11-22 NOTE — Patient Instructions (Signed)

## 2022-11-23 ENCOUNTER — Other Ambulatory Visit: Payer: Medicare Other

## 2022-11-23 VITALS — BP 150/82 | HR 99 | Temp 97.4°F

## 2022-11-23 DIAGNOSIS — Z515 Encounter for palliative care: Secondary | ICD-10-CM

## 2022-11-23 NOTE — Progress Notes (Signed)
PATIENT NAME: Lorraine Wyatt DOB: 26-Aug-1945 MRN: [id number]  PRIMARY CARE PROVIDER: Jerl Mina, MD  RESPONSIBLE PARTY:  Acct ID - Guarantor Home Phone Work Phone Relationship Acct Type  [id number] [name]* [phone]  Self P/F     [address], [place], [place] [address]   Home visit completed with patient and granddaughter [name].  Infection:  Treated with antibiotics in the last month for UTI.  No further issues reported by patient.   Parkinson's Disease:  Recently decreased on Sinemet to 25/100 6 x daily.  Patient will notify PCP in 1-2 weeks if this does not work well.  Patient feels her legs are heavy.  She is reporting more ridgity and stiffness.   Also feels pills and foods are becoming more difficult to swallow.  We discussed precautions with swallowing and taking smaller bites, chopped meats, etc.   Patient continues to have frequent falls.   Thankfully, no injuries.  She continues to work with PT weekly for strengthening.  Patient advised she was recently re-certified due to her functional decline.   Follow up visit scheduled in 2 weeks to follow up on overall status.  CODE STATUS: DNR ADVANCED DIRECTIVES: Yes MOST FORM: No PPS: 40%   PHYSICAL EXAM:   VITALS: Today's Vitals   11/23/22 1027  BP: (!) 150/82  Pulse: 99  Temp: (!) 97.4 F (36.3 C)  SpO2: 99%    LUNGS: clear to auscultation  CARDIAC: Cor RRR}  EXTREMITIES: - for edema SKIN: Skin color, texture, turgor normal. No rashes or lesions or mobility and turgor normal  NEURO: positive for gait problems and memory problems       Truitt Merle, RN

## 2022-11-26 MED FILL — Iron Sucrose Inj 20 MG/ML (Fe Equiv): INTRAVENOUS | Qty: 10 | Status: AC

## 2022-11-30 ENCOUNTER — Inpatient Hospital Stay: Payer: Medicare Other

## 2022-12-06 ENCOUNTER — Inpatient Hospital Stay: Payer: Medicare Other | Attending: Internal Medicine

## 2022-12-06 VITALS — BP 161/79 | HR 99 | Temp 97.2°F | Resp 18

## 2022-12-06 DIAGNOSIS — Z79899 Other long term (current) drug therapy: Secondary | ICD-10-CM | POA: Insufficient documentation

## 2022-12-06 DIAGNOSIS — D509 Iron deficiency anemia, unspecified: Secondary | ICD-10-CM | POA: Diagnosis present

## 2022-12-06 DIAGNOSIS — G20A1 Parkinson's disease without dyskinesia, without mention of fluctuations: Secondary | ICD-10-CM | POA: Insufficient documentation

## 2022-12-06 MED ORDER — SODIUM CHLORIDE 0.9 % IV SOLN
200.0000 mg | Freq: Once | INTRAVENOUS | Status: AC
  Administered 2022-12-06: 200 mg via INTRAVENOUS
  Filled 2022-12-06: qty 200

## 2022-12-06 MED ORDER — SODIUM CHLORIDE 0.9 % IV SOLN
Freq: Once | INTRAVENOUS | Status: AC
  Filled 2022-12-06: qty 250

## 2022-12-06 NOTE — Patient Instructions (Signed)

## 2022-12-06 NOTE — Progress Notes (Signed)
Pt declined 30 minute post-observation due to missing her dose of Sinemet and was unable to remain comfortable in the recliner. Vitals stable at discharge.

## 2022-12-08 ENCOUNTER — Other Ambulatory Visit: Payer: Medicare Other

## 2022-12-08 VITALS — BP 180/100 | HR 97 | Temp 97.4°F | Wt 113.4 lb

## 2022-12-08 DIAGNOSIS — Z515 Encounter for palliative care: Secondary | ICD-10-CM

## 2022-12-08 NOTE — Progress Notes (Signed)
PATIENT NAME: Lorraine Wyatt DOB: 1946-05-29 MRN: [id number]  PRIMARY CARE PROVIDER: Jerl Mina, MD  RESPONSIBLE PARTY:  Acct ID - Guarantor Home Phone Work Phone Relationship Acct Type  [id number] Lorraine Wyatt* [phone]  Self P/F     [address], [place], [place] [address]     Home visit completed with patient and granddaughter Lorraine Wyatt.  DME: Patient was seen at [facility] regarding a new wheelchair.  Paperwork needed to be completed and was faxed to Dr. Daisy Blossom office.  Follow up call made to [facility] and paperwork was faxed on 5/23 but they have not received forms back.  250 pm.  Phone call made to Dr. Daisy Blossom office to follow up on forms.  Response pending.   Hypertension:  BP at 180/100 today.  Rechecked in left arm x 2.   BP on 12/06/22 161/79.  Patient denies any headaches or dizziness.   Confirms she took losartan 25 mg this am.  Patient states her losartan was decreased several months ago due to hypotension.  Advised I would notify PCP of reading today. 237 pm.  Phone call to PCP office to advised of elevated blood pressure.   Medication Management:  Patient was not aware of increase in sinemet that is documented in a telephone note on 11/25/22.  States she did increase it back to previous dosage of 2 tabs 7x daily.  Patient feels medication does not seem as effective as it did previously.  She will be following up with neurology on Tuesday to further discuss.  Patient notes periods of increase drooling and heaviness in her legs (R<L).   Pain:  Patient with pain to her left hip and knee that has been ongoing.  She is taking Tylenol 1000 mg about every 6 hours.     CODE STATUS: DNR ADVANCED DIRECTIVES: Yes MOST FORM: No PPS: 40%   PHYSICAL EXAM:   VITALS: Today's Vitals   12/08/22 1316  Pulse: 97  Temp: (!) 97.4 F (36.3 C)  SpO2: 98%  Weight: 113 lb 6.4 oz (51.4 kg)    LUNGS: clear to auscultation  CARDIAC: Cor RRR}  EXTREMITIES: - for  edema SKIN: Skin color, texture, turgor normal. No rashes or lesions or mobility and turgor normal  NEURO: positive for gait problems       Truitt Merle, RN

## 2022-12-09 ENCOUNTER — Telehealth: Payer: Self-pay

## 2022-12-09 IMAGING — US US BREAST*L* LIMITED INC AXILLA
1 series · 5 of 5 positions shown · non-contrast
Comparison: Previous exam(s).

CLINICAL DATA: Callback from screening mammogram for possible mass
left breast

EXAM:
DIGITAL DIAGNOSTIC UNILATERAL LEFT MAMMOGRAM WITH TOMOSYNTHESIS AND
CAD; ULTRASOUND LEFT BREAST LIMITED
TECHNIQUE: Left digital diagnostic mammography and breast tomosynthesis was
performed. The images were evaluated with computer-aided detection.;
Targeted ultrasound examination of the left breast was performed.

[Series 1: us breast*left* limited inc axilla · 0.07mm/px · 5 of 5 slices shown]
[im 1/5]
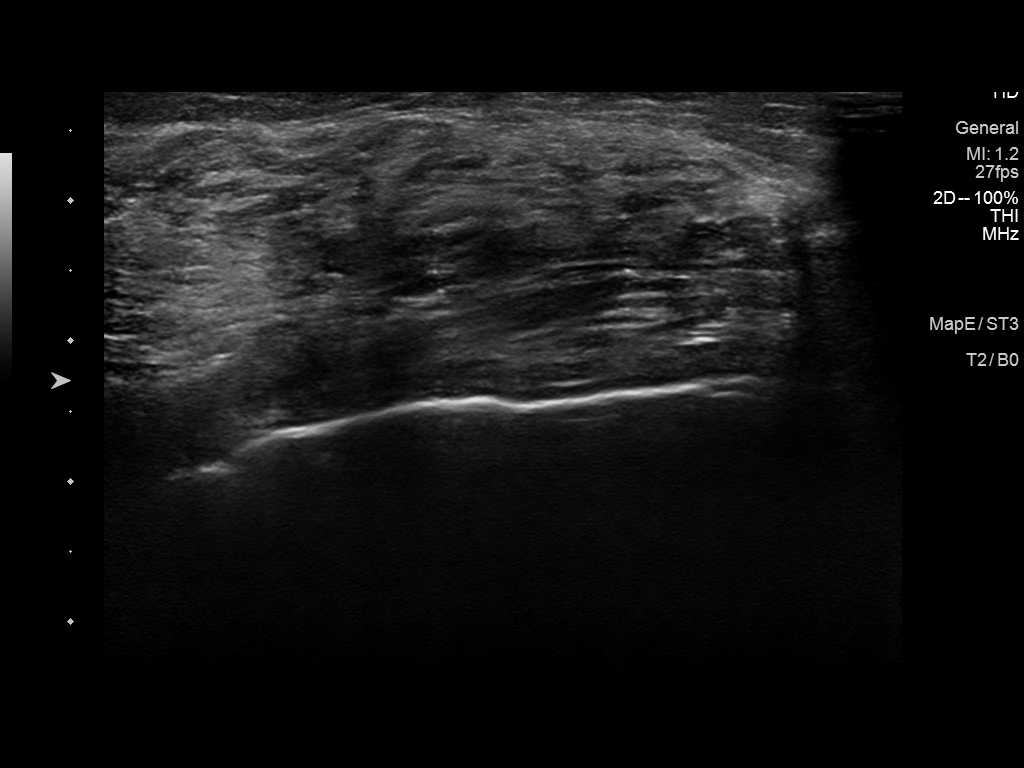
[im 2/5]
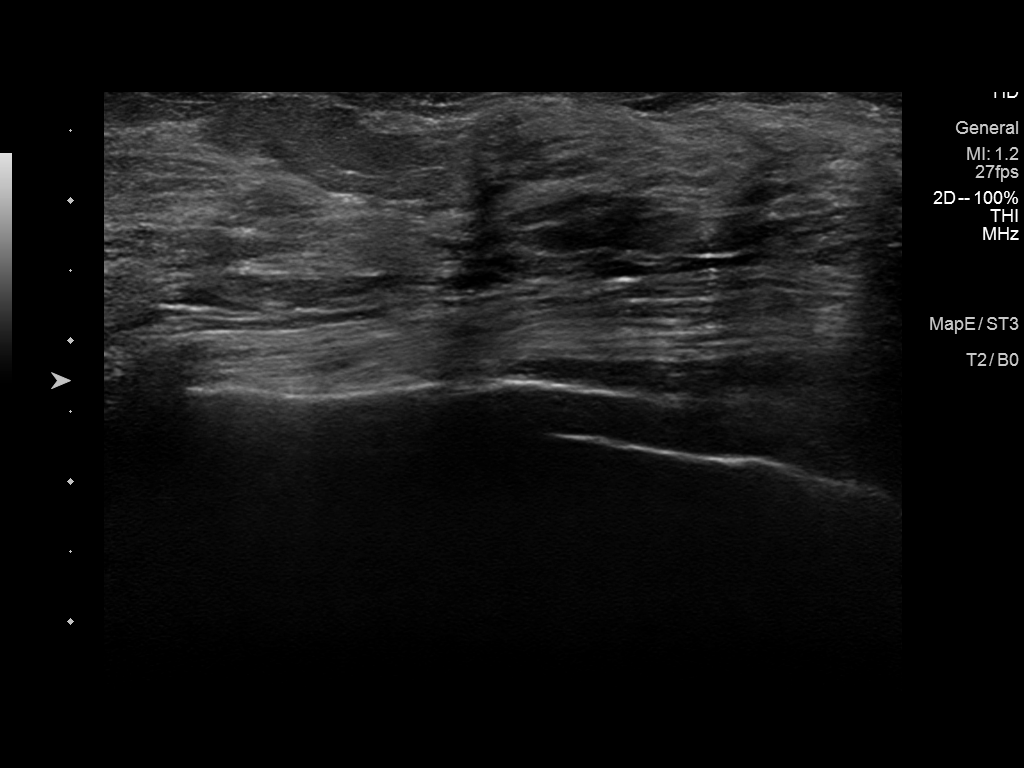
[im 3/5]
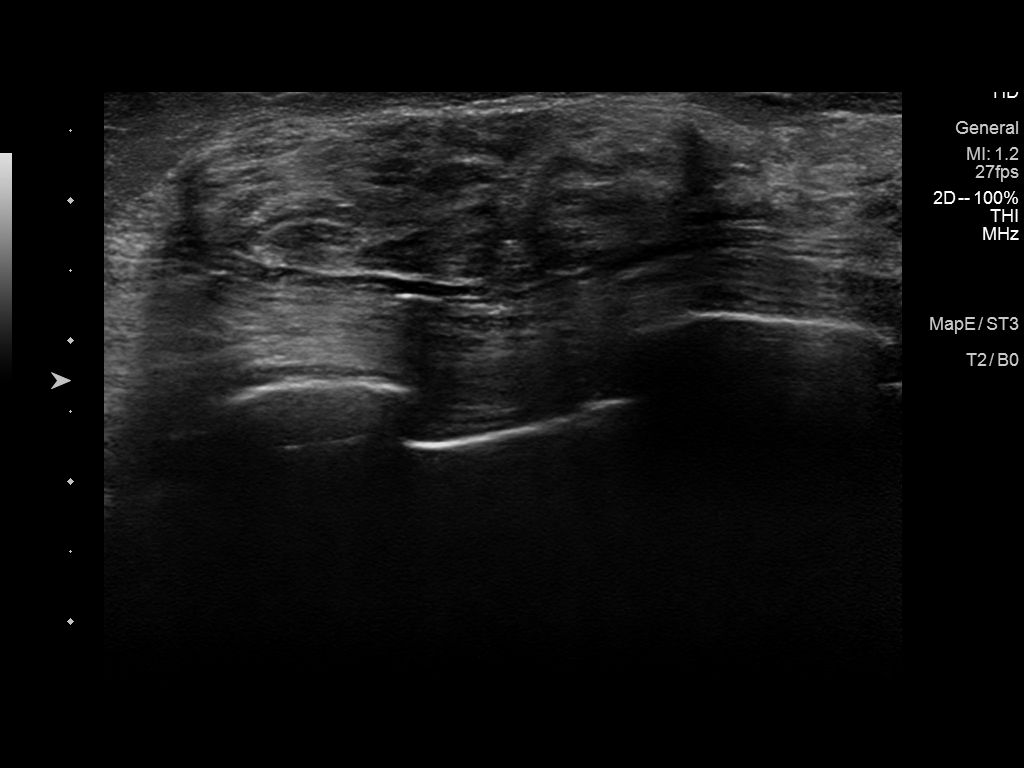
[im 4/5]
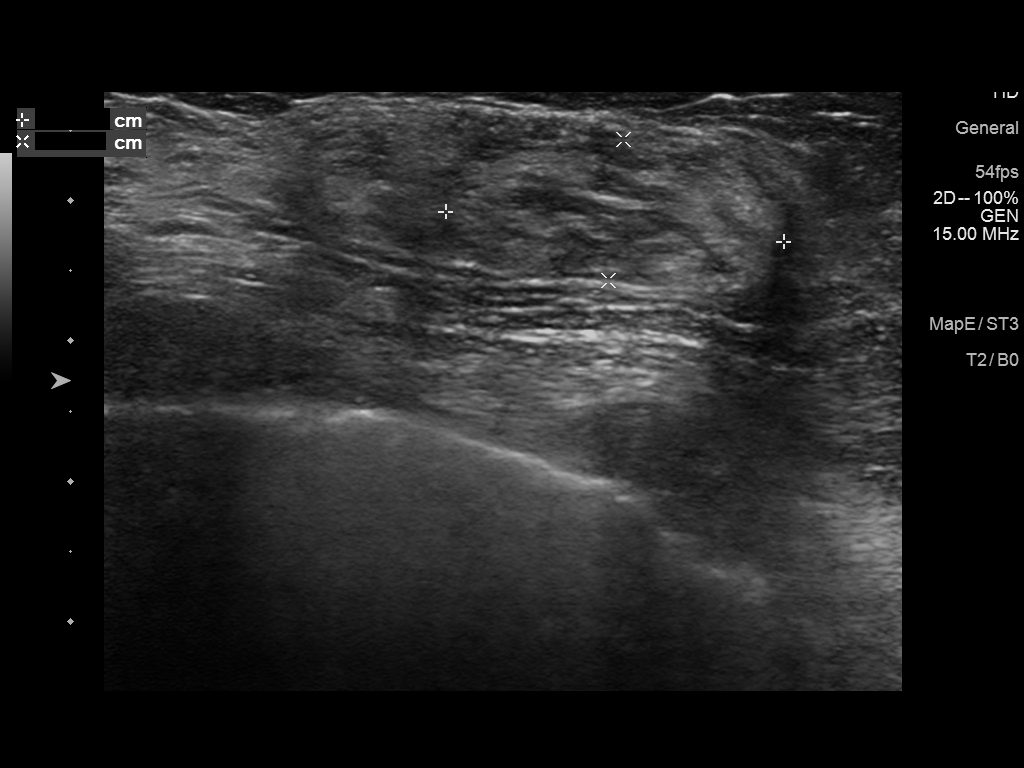
[im 5/5]
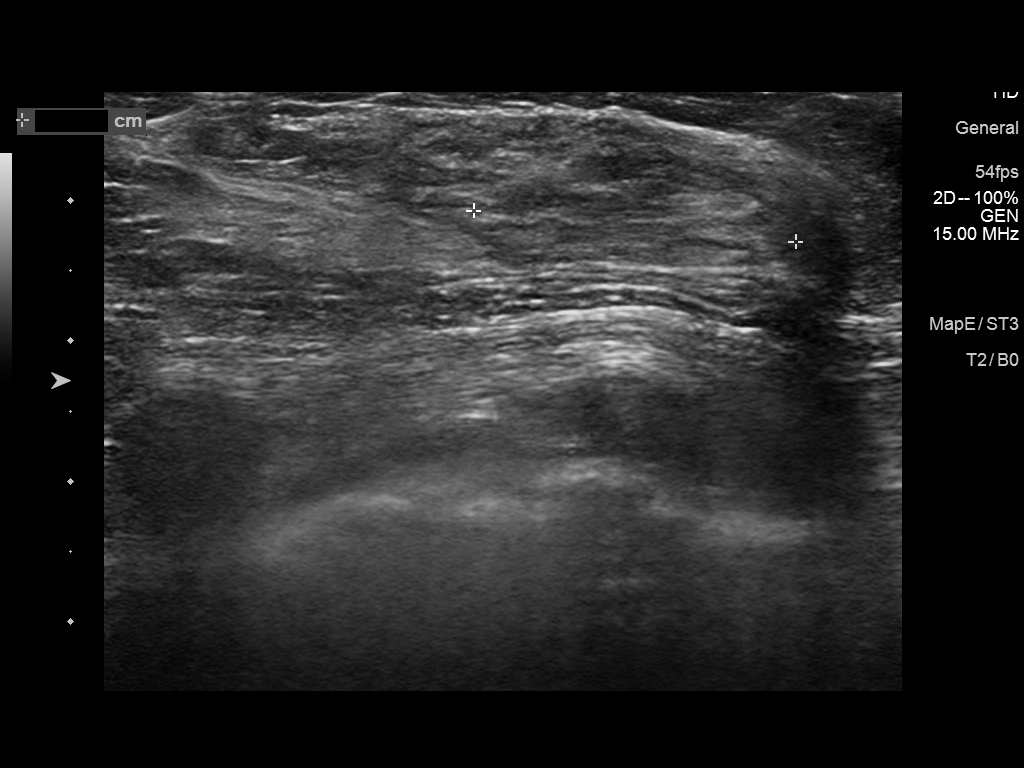

[5 of 5 positions shown; findings below may reference images not displayed]

ACR Breast Density Category c: The breast tissue is heterogeneously
dense, which may obscure small masses.
FINDINGS: Spot compression cc and MLO views of the left breast are submitted.
Previously questioned mass in the posterior upper-outer quadrant
left breast is persistent. On the spot compression left MLO view,
the area of tissue appears unchanged compared to prior mammogram of

Targeted ultrasound is performed, showing a 2.4 x 1 x 2.3 cm mixed
echotexture oval mass with through transmission at the left breast 2
o'clock 7 cm from nipple correlating to the mammographic finding.
This is probably a fibroadenoma.
IMPRESSION: Probable benign findings.

RECOMMENDATION:
Six-month follow-up mammogram and ultrasound of left breast.

I have discussed the findings and recommendations with the patient.
If applicable, a reminder letter will be sent to the patient
regarding the next appointment.

BI-RADS CATEGORY  3: Probably benign.

## 2022-12-09 IMAGING — MG MM DIGITAL DIAGNOSTIC UNILAT*L* W/ TOMO W/ CAD
4 series · 4 of 12 positions shown · non-contrast
Comparison: Previous exam(s).

CLINICAL DATA: Callback from screening mammogram for possible mass
left breast

EXAM:
DIGITAL DIAGNOSTIC UNILATERAL LEFT MAMMOGRAM WITH TOMOSYNTHESIS AND
CAD; ULTRASOUND LEFT BREAST LIMITED
TECHNIQUE: Left digital diagnostic mammography and breast tomosynthesis was
performed. The images were evaluated with computer-aided detection.;
Targeted ultrasound examination of the left breast was performed.

[L CC synth-2D]
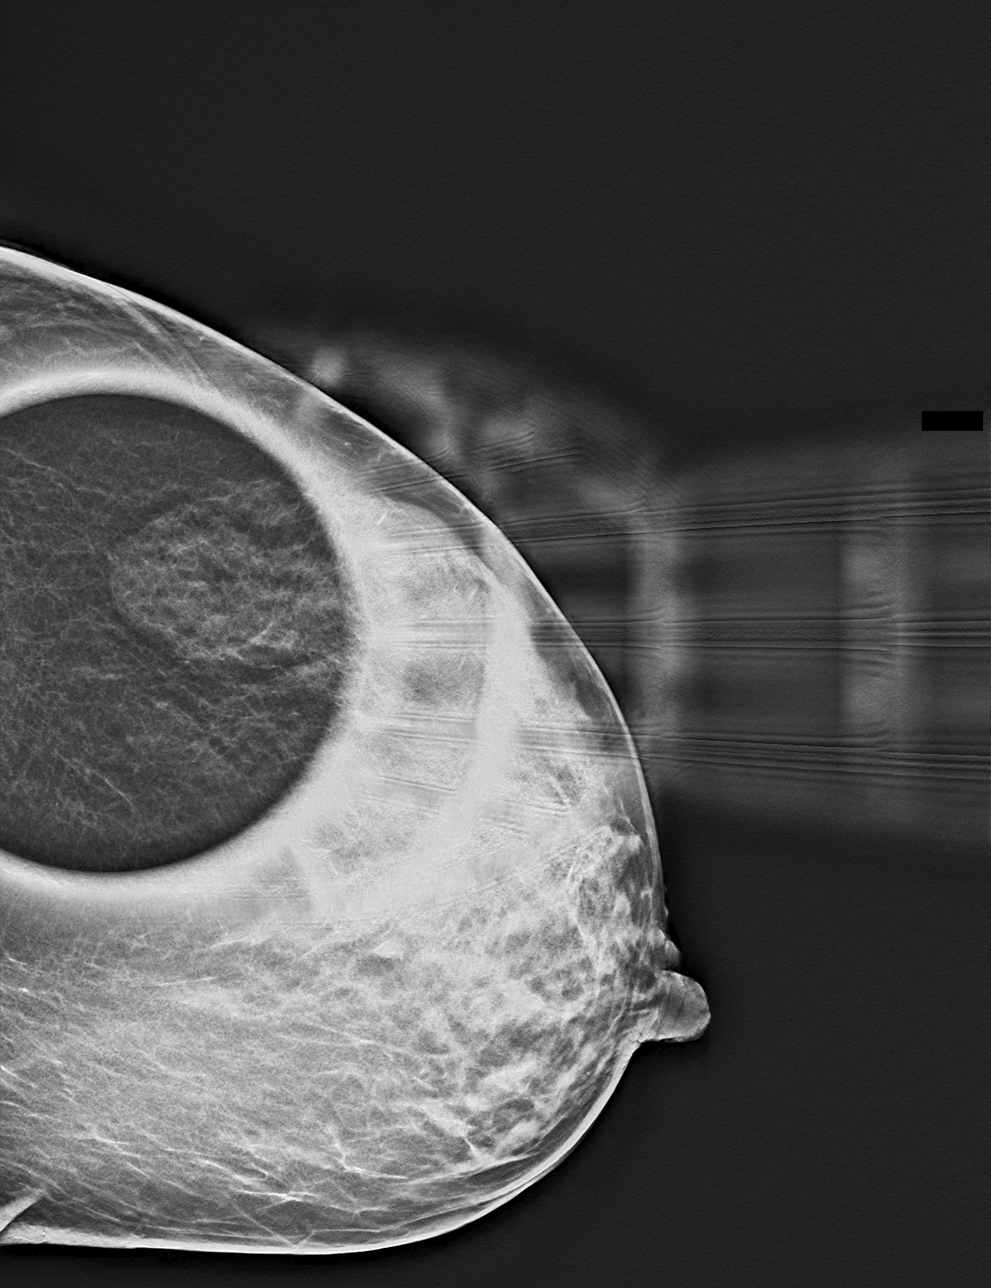

[L MLO synth-2D]
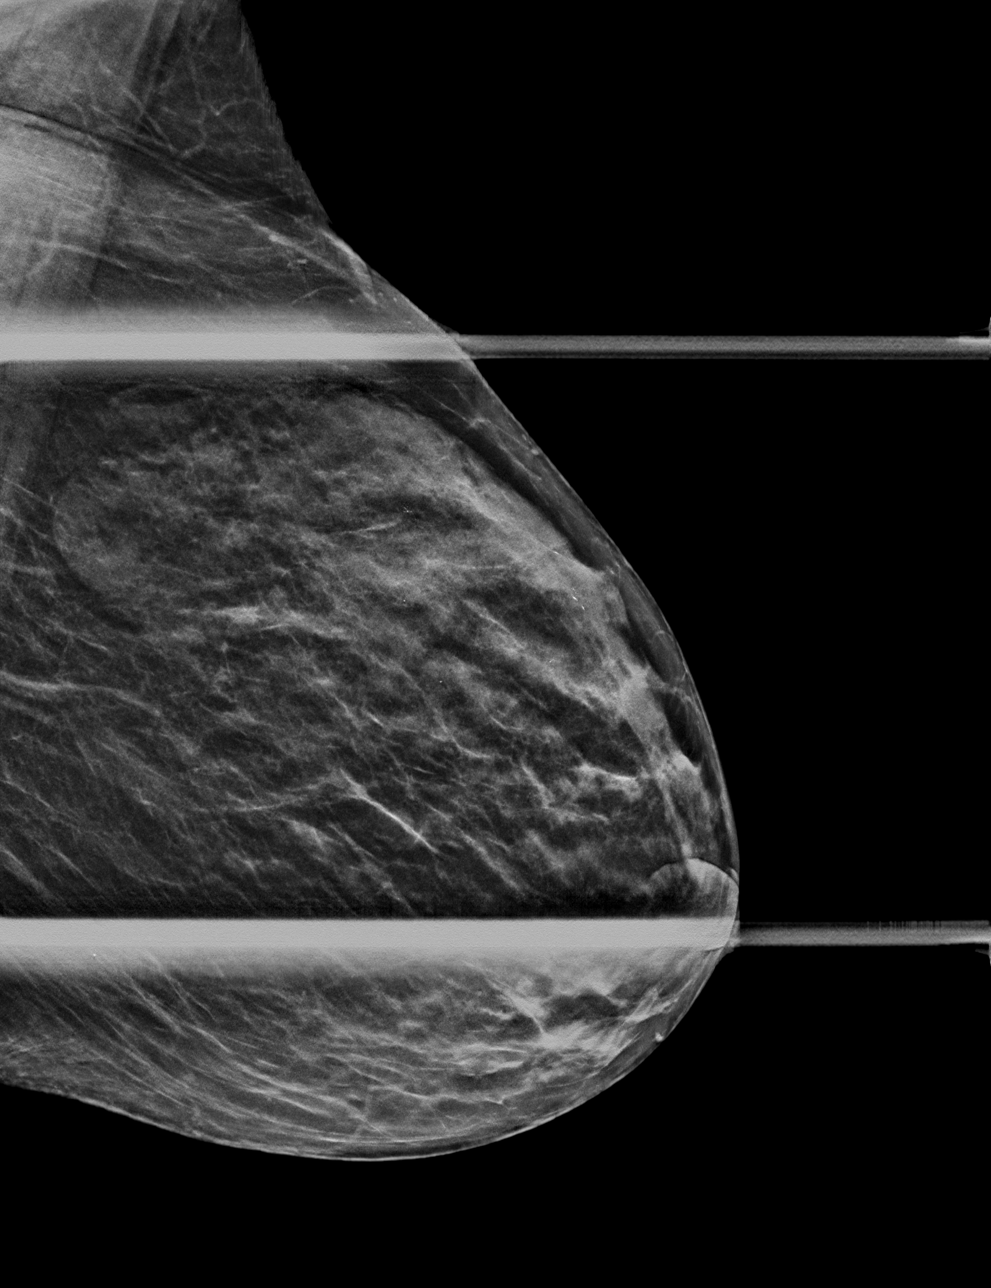

[L MLO tomo · tomo slice 21/41.0]
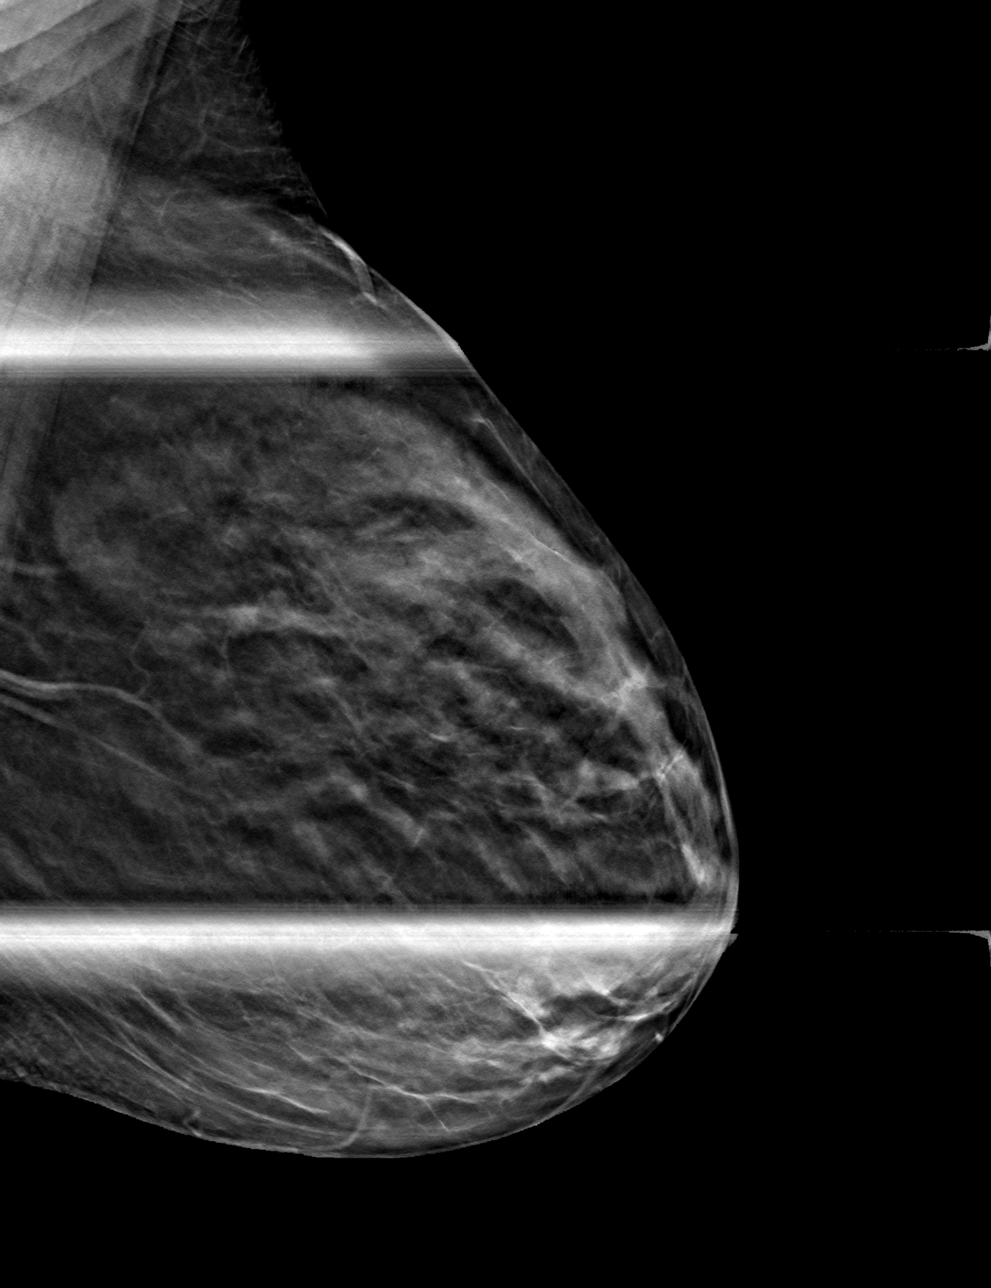

[L CC tomo · tomo slice 14/27.0]
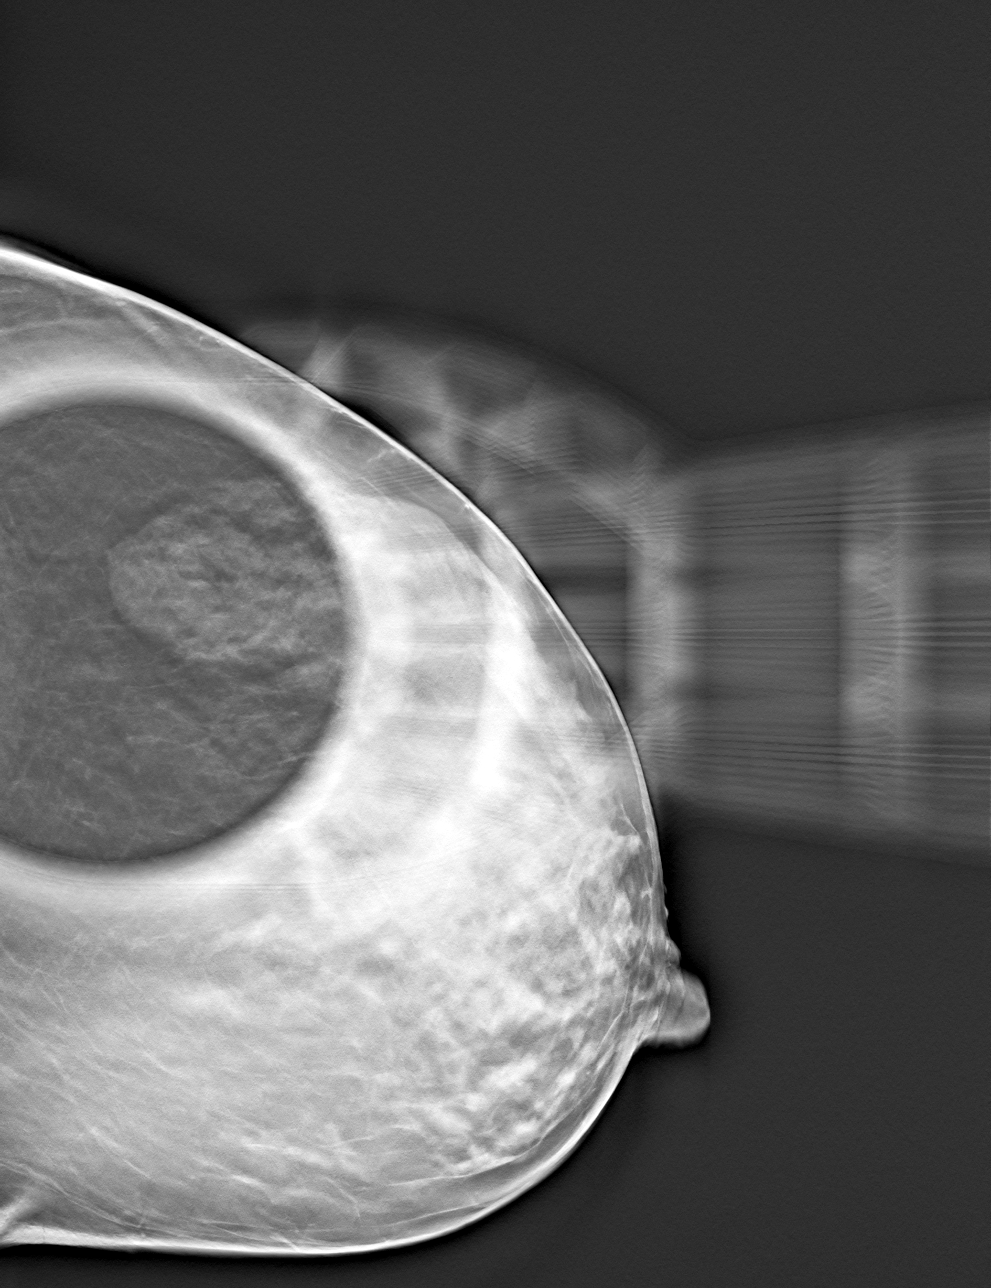

[4 of 12 positions shown; findings below may reference images not displayed]

ACR Breast Density Category c: The breast tissue is heterogeneously
dense, which may obscure small masses.
FINDINGS: Spot compression cc and MLO views of the left breast are submitted.
Previously questioned mass in the posterior upper-outer quadrant
left breast is persistent. On the spot compression left MLO view,
the area of tissue appears unchanged compared to prior mammogram of

Targeted ultrasound is performed, showing a 2.4 x 1 x 2.3 cm mixed
echotexture oval mass with through transmission at the left breast 2
o'clock 7 cm from nipple correlating to the mammographic finding.
This is probably a fibroadenoma.
IMPRESSION: Probable benign findings.

RECOMMENDATION:
Six-month follow-up mammogram and ultrasound of left breast.

I have discussed the findings and recommendations with the patient.
If applicable, a reminder letter will be sent to the patient
regarding the next appointment.

BI-RADS CATEGORY  3: Probably benign.

## 2022-12-09 NOTE — Telephone Encounter (Signed)
927 am.  Received call from Lorraine Wyatt with Dr. Webb Silversmith office regarding elevated bp.  New orders obtained to increase losartan to 50 mg daily and monitor blood pressures.  If new script is needed, contact PCP office for request.    Phone call made to patient to provide an update on above and also advised that Dr. Daisy Blossom office was notified regarding wheelchair.   No new concerns voiced by patient.

## 2022-12-10 MED FILL — Iron Sucrose Inj 20 MG/ML (Fe Equiv): INTRAVENOUS | Qty: 10 | Status: AC

## 2022-12-13 ENCOUNTER — Encounter (HOSPITAL_BASED_OUTPATIENT_CLINIC_OR_DEPARTMENT_OTHER): Payer: Self-pay

## 2022-12-13 ENCOUNTER — Emergency Department (HOSPITAL_BASED_OUTPATIENT_CLINIC_OR_DEPARTMENT_OTHER): Payer: Medicare Other | Admitting: Radiology

## 2022-12-13 ENCOUNTER — Emergency Department (HOSPITAL_BASED_OUTPATIENT_CLINIC_OR_DEPARTMENT_OTHER)
Admission: EM | Admit: 2022-12-13 | Discharge: 2022-12-13 | Disposition: A | Discharge status: Home / Self Care | Payer: Medicare Other | Attending: Emergency Medicine | Admitting: Emergency Medicine

## 2022-12-13 ENCOUNTER — Inpatient Hospital Stay: Payer: Medicare Other

## 2022-12-13 ENCOUNTER — Other Ambulatory Visit: Payer: Self-pay

## 2022-12-13 DIAGNOSIS — G20C Parkinsonism, unspecified: Secondary | ICD-10-CM | POA: Insufficient documentation

## 2022-12-13 DIAGNOSIS — M25552 Pain in left hip: Secondary | ICD-10-CM | POA: Diagnosis present

## 2022-12-13 DIAGNOSIS — R1084 Generalized abdominal pain: Secondary | ICD-10-CM | POA: Diagnosis not present

## 2022-12-13 LAB — COMPREHENSIVE METABOLIC PANEL
ALT: <5 U/L (ref 0–44)
AST: 11 U/L — ABNORMAL LOW (ref 15–41)
Albumin: 4.2 g/dL (ref 3.5–5.0)
Alkaline Phosphatase: 74 U/L (ref 38–126)
Anion gap: 8 (ref 5–15)
BUN: 28 mg/dL — ABNORMAL HIGH (ref 8–23)
CO2: 25 mmol/L (ref 22–32)
Calcium: 9.5 mg/dL (ref 8.9–10.3)
Chloride: 96 mmol/L — ABNORMAL LOW (ref 98–111)
Creatinine, Ser: 0.77 mg/dL (ref 0.44–1.00)
GFR, Estimated: >60 mL/min (ref >60)
Glucose, Bld: 114 mg/dL — ABNORMAL HIGH (ref 70–99)
Potassium: 4.9 mmol/L (ref 3.5–5.1)
Sodium: 129 mmol/L — ABNORMAL LOW (ref 135–145)
Total Bilirubin: 0.4 mg/dL (ref 0.3–1.2)
Total Protein: 6.6 g/dL (ref 6.5–8.1)

## 2022-12-13 LAB — URINALYSIS, ROUTINE W REFLEX MICROSCOPIC
Bilirubin Urine: NEGATIVE
Glucose, UA: NEGATIVE mg/dL
Ketones, ur: NEGATIVE mg/dL
Leukocytes,Ua: NEGATIVE
Nitrite: NEGATIVE
Specific Gravity, Urine: 1.01 (ref 1.005–1.030)
pH: 7 (ref 5.0–8.0)

## 2022-12-13 LAB — CBC
HCT: 29.4 % — ABNORMAL LOW (ref 36.0–46.0)
Hemoglobin: 9.7 g/dL — ABNORMAL LOW (ref 12.0–15.0)
MCH: 26.3 pg (ref 26.0–34.0)
MCHC: 33 g/dL (ref 30.0–36.0)
MCV: 79.7 fL — ABNORMAL LOW (ref 80.0–100.0)
Platelets: 318 10*3/uL (ref 150–400)
RBC: 3.69 MIL/uL — ABNORMAL LOW (ref 3.87–5.11)
RDW: 20.7 % — ABNORMAL HIGH (ref 11.5–15.5)
WBC: 9.9 10*3/uL (ref 4.0–10.5)
nRBC: 0 % (ref 0.0–0.2)

## 2022-12-13 MED ORDER — ACETAMINOPHEN 500 MG PO TABS
1000.0000 mg | ORAL_TABLET | Freq: Once | ORAL | Status: AC
  Administered 2022-12-13: 1000 mg via ORAL
  Filled 2022-12-13: qty 2

## 2022-12-13 MED ORDER — MORPHINE SULFATE (PF) 4 MG/ML IV SOLN
4.0000 mg | Freq: Once | INTRAVENOUS | Status: AC
  Administered 2022-12-13: 4 mg via INTRAVENOUS
  Filled 2022-12-13: qty 1

## 2022-12-13 MED ORDER — OXYCODONE HCL 5 MG PO TABS
5.0000 mg | ORAL_TABLET | Freq: Once | ORAL | Status: AC
  Administered 2022-12-13: 5 mg via ORAL
  Filled 2022-12-13: qty 1

## 2022-12-13 MED ORDER — OXYCODONE HCL 5 MG PO TABS: 2.5000 mg | ORAL_TABLET | Freq: Four times a day (QID) | ORAL | 0 refills | Status: DC | PRN

## 2022-12-13 NOTE — ED Provider Notes (Signed)
[place] EMERGENCY DEPARTMENT AT [facility] Provider Note   CSN: [id number] Arrival date & time: 12/13/22  1808     History Chief Complaint  Patient presents with   Pelvic Pain    HPI NOVEMBER FINISTER is a 77 y.o. female presenting for chief complaint of severe left-sided hip pain.  States that she has chronic hip pain but today is worse.  She is crying out in pain secondary to discomfort here. Has an extensive medical history including Parkinson's disease with tardive dyskinesia from the medications.  Is thrashing in the bed which she states is her normal tremors.  [name] recorded medical, surgical, social, medication list and allergies were reviewed in the Snapshot window as part of the initial history.   Review of Systems   Review of Systems  Constitutional:  Negative for chills and fever.  HENT:  Negative for ear pain and sore throat.   Eyes:  Negative for pain and visual disturbance.  Respiratory:  Negative for cough and shortness of breath.   Cardiovascular:  Negative for chest pain and palpitations.  Gastrointestinal:  Negative for abdominal pain and vomiting.  Genitourinary:  Negative for dysuria and hematuria.  Musculoskeletal:  Negative for arthralgias and back pain.  Skin:  Negative for color change and rash.  Neurological:  Positive for tremors. Negative for seizures and syncope.  All other systems reviewed and are negative.   Physical Exam Updated Vital Signs BP (!) 159/72   Pulse 98   Temp 98.1 F (36.7 C)   Resp 20   SpO2 99%  Physical Exam Vitals and nursing note reviewed.  Constitutional:      General: She is not in acute distress.    Appearance: She is well-developed.  HENT:     Head: Normocephalic and atraumatic.  Eyes:     Conjunctiva/sclera: Conjunctivae normal.  Cardiovascular:     Rate and Rhythm: Normal rate and regular rhythm.     Heart sounds: No murmur heard. Pulmonary:     Effort: Pulmonary effort is normal. No  respiratory distress.     Breath sounds: Normal breath sounds.  Abdominal:     General: There is no distension.     Palpations: Abdomen is soft.     Tenderness: There is no abdominal tenderness. There is no right CVA tenderness or left CVA tenderness.  Musculoskeletal:        General: Tenderness (Tenderness palpation of the left hip otherwise no acute pathology.) present. No swelling. Normal range of motion.     Cervical back: Neck supple.  Skin:    General: Skin is warm and dry.  Neurological:     General: No focal deficit present.     Mental Status: She is alert and oriented to person, place, and time. Mental status is at baseline.     Cranial Nerves: No cranial nerve deficit.      ED Course/ Medical Decision Making/ A&P    Procedures Procedures   Medications Ordered in ED Medications  morphine (PF) 4 MG/ML injection 4 mg (4 mg Intravenous Given 12/13/22 1856)  oxyCODONE (Oxy IR/ROXICODONE) immediate release tablet 5 mg (5 mg Oral Given 12/13/22 2106)  acetaminophen (TYLENOL) tablet 1,000 mg (1,000 mg Oral Given 12/13/22 2106)    Medical Decision Making:   This a complex 77 year old female presenting with a chief complaint of left hip pain.  Is been intermittent over the past few days.  She has a history of tardive dyskinesia from her parkinsonism and medications and  has had worsening of her mobility symptoms.  She is completely unable to hold still with very large chorea like movements of her full body. She has very large motion.  Anytime she moves she endorses that her left hip hurts.  States it has been present.  Denies fevers or chills, nausea vomiting, syncope shortness of breath. Broad differential was considered including intra-abdominal pathology such appendicitis, cholecystitis, the seem less likely based on this presentation.  Her pain localizes more so to the hip.  Therefore underlying fracture or septic arthritis considered but she is able to move the hip painlessly and  to put pressure on it.  The seem less likely based upon this finding.  Underlying fracture or metastatic lesion is on the differential x-ray does not demonstrate any of these. Will treat for her symptoms and plan for reassessment Reassessment: Reassessed after 4 hours in the emergency room.  Pain is under control with by mouth oxycodone and Tylenol.  She is significantly improved.  Uncertain underlying etiology of her pain likely musculoskeletal based on her persistent motion but still difficult to pinpoint.  They have follow-up with neurology in the morning which I believe would be reasonable and patient will also need 48-hour follow-up with PCP for further diagnostic care and management. Will send short course of pain control for likely musculoskeletal etiology and recommend representation should she have interval worsening as she may need an intra-abdominal imaging if she has recurrence of the symptoms.  Disposition:  I have considered need for hospitalization, however, considering all of the above, I believe this patient is stable for discharge at this time.  Patient/family educated about specific return precautions for given chief complaint and symptoms.  Patient/family educated about follow-up with PCP.     Patient/family expressed understanding of return precautions and need for follow-up. Patient spoken to regarding all imaging and laboratory results and appropriate follow up for these results. All education provided in verbal form with additional information in written form. Time was allowed for answering of patient questions. Patient discharged.    Emergency Department Medication Summary:   Medications  morphine (PF) 4 MG/ML injection 4 mg (4 mg Intravenous Given 12/13/22 1856)  oxyCODONE (Oxy IR/ROXICODONE) immediate release tablet 5 mg (5 mg Oral Given 12/13/22 2106)  acetaminophen (TYLENOL) tablet 1,000 mg (1,000 mg Oral Given 12/13/22 2106)        Clinical Impression:  1.  Generalized abdominal pain      Discharge   Final Clinical Impression(s) / ED Diagnoses Final diagnoses:  Generalized abdominal pain    Rx / DC Orders ED Discharge Orders     None         Glyn Ade, MD 12/13/22 2210

## 2022-12-13 NOTE — ED Notes (Signed)
Brief placed on pt per pt request for D/C transportation

## 2022-12-13 NOTE — ED Notes (Signed)
Pt placed on a purewick due to the inability to stand and the severe pelvic pain.

## 2022-12-13 NOTE — ED Triage Notes (Signed)
Patient here POV from Home.  Endorses Left Sided Pelvic Pain that has been Intermittent for a few days. Severe and Constant today.  Unable to remain still due to pain in triage. A&Ox4. GCS 15. BIB Personal Wheelchair.

## 2022-12-13 NOTE — Progress Notes (Signed)
Pt arrived to infusion suite today.  Pt in tears and refused treatment asking to go home before any care could be provided.  MD notified.  Pt left infusion suite stable in a wheelchair with her family member.

## 2022-12-27 ENCOUNTER — Ambulatory Visit: Payer: Medicare Other

## 2023-01-07 ENCOUNTER — Other Ambulatory Visit: Payer: Medicare Other

## 2023-01-12 ENCOUNTER — Other Ambulatory Visit: Payer: Self-pay | Admitting: Neurology

## 2023-01-12 DIAGNOSIS — I639 Cerebral infarction, unspecified: Secondary | ICD-10-CM

## 2023-01-13 ENCOUNTER — Emergency Department: Payer: Medicare Other

## 2023-01-13 ENCOUNTER — Other Ambulatory Visit: Payer: Self-pay

## 2023-01-13 ENCOUNTER — Emergency Department
Admission: EM | Admit: 2023-01-13 | Discharge: 2023-01-13 | Discharge status: Against Medical Advice | Payer: Medicare Other | Attending: Emergency Medicine | Admitting: Emergency Medicine

## 2023-01-13 DIAGNOSIS — S0101XA Laceration without foreign body of scalp, initial encounter: Secondary | ICD-10-CM | POA: Diagnosis not present

## 2023-01-13 DIAGNOSIS — W01190A Fall on same level from slipping, tripping and stumbling with subsequent striking against furniture, initial encounter: Secondary | ICD-10-CM | POA: Insufficient documentation

## 2023-01-13 DIAGNOSIS — S0181XA Laceration without foreign body of other part of head, initial encounter: Secondary | ICD-10-CM | POA: Diagnosis present

## 2023-01-13 DIAGNOSIS — Z5321 Procedure and treatment not carried out due to patient leaving prior to being seen by health care provider: Secondary | ICD-10-CM | POA: Insufficient documentation

## 2023-01-13 NOTE — ED Triage Notes (Addendum)
Pt to ED via POV c/o head lac after a fall at 7pm. Pt reports she was on couch when she wanted to reach for rollater and fell, hitting head on table. Pt with 1in lac to posterior scalp, bleeding under control, wrapped in gauze

## 2023-01-14 NOTE — ED Notes (Signed)
Lwot--reviewed ct scans done prior to lwot.  Incidental finding thyroid nodule--called to [facility] Dr. Delia Chimes nurse will do follow up.

## 2023-01-17 ENCOUNTER — Ambulatory Visit: Payer: Medicare Other

## 2023-01-18 ENCOUNTER — Other Ambulatory Visit: Payer: Self-pay | Admitting: Family Medicine

## 2023-01-18 DIAGNOSIS — R937 Abnormal findings on diagnostic imaging of other parts of musculoskeletal system: Secondary | ICD-10-CM

## 2023-01-18 DIAGNOSIS — E041 Nontoxic single thyroid nodule: Secondary | ICD-10-CM

## 2023-01-31 ENCOUNTER — Ambulatory Visit
Admission: RE | Admit: 2023-01-31 | Discharge: 2023-01-31 | Disposition: A | Discharge status: Home / Self Care | Payer: Medicare Other | Source: Ambulatory Visit | Attending: Family Medicine | Admitting: Family Medicine

## 2023-01-31 DIAGNOSIS — E041 Nontoxic single thyroid nodule: Secondary | ICD-10-CM | POA: Diagnosis present

## 2023-01-31 DIAGNOSIS — R937 Abnormal findings on diagnostic imaging of other parts of musculoskeletal system: Secondary | ICD-10-CM | POA: Insufficient documentation

## 2023-04-11 ENCOUNTER — Inpatient Hospital Stay: Payer: Medicare Other | Attending: Internal Medicine

## 2023-04-11 ENCOUNTER — Inpatient Hospital Stay: Payer: Medicare Other | Admitting: Internal Medicine

## 2024-04-04 DEATH — deceased
